# Patient Record
Sex: Male | Born: 1950
Health system: Southern US, Community
[De-identification: ages and names within clinical notes are randomized; demographics above are authoritative.]

## PROBLEM LIST (undated history)

## (undated) DIAGNOSIS — I639 Cerebral infarction, unspecified: Secondary | ICD-10-CM

## (undated) DIAGNOSIS — I10 Essential (primary) hypertension: Secondary | ICD-10-CM

## (undated) DIAGNOSIS — R51 Headache: Secondary | ICD-10-CM

## (undated) DIAGNOSIS — E785 Hyperlipidemia, unspecified: Secondary | ICD-10-CM

## (undated) DIAGNOSIS — M1990 Unspecified osteoarthritis, unspecified site: Secondary | ICD-10-CM

## (undated) DIAGNOSIS — C801 Malignant (primary) neoplasm, unspecified: Secondary | ICD-10-CM

## (undated) HISTORY — PX: APPENDECTOMY: SHX54

---

## 2000-05-10 ENCOUNTER — Ambulatory Visit (HOSPITAL_BASED_OUTPATIENT_CLINIC_OR_DEPARTMENT_OTHER): Admission: RE | Admit: 2000-05-10 | Discharge: 2000-05-10 | Payer: Self-pay | Admitting: Orthopedic Surgery

## 2003-04-19 ENCOUNTER — Inpatient Hospital Stay (HOSPITAL_COMMUNITY): Admission: RE | Admit: 2003-04-19 | Discharge: 2003-04-21 | Payer: Self-pay | Admitting: Surgery

## 2003-10-03 ENCOUNTER — Emergency Department (HOSPITAL_COMMUNITY): Admission: EM | Admit: 2003-10-03 | Discharge: 2003-10-03 | Payer: Self-pay | Admitting: Emergency Medicine

## 2006-04-16 HISTORY — PX: PROSTATECTOMY: SHX69

## 2011-02-28 ENCOUNTER — Encounter: Payer: Self-pay | Admitting: Emergency Medicine

## 2011-02-28 ENCOUNTER — Inpatient Hospital Stay (HOSPITAL_COMMUNITY)
Admission: EM | Admit: 2011-02-28 | Discharge: 2011-03-04 | DRG: 066 | Disposition: A | Discharge status: Home / Self Care | Payer: Self-pay | Attending: Internal Medicine | Admitting: Internal Medicine

## 2011-02-28 ENCOUNTER — Emergency Department (HOSPITAL_COMMUNITY): Payer: Self-pay

## 2011-02-28 ENCOUNTER — Other Ambulatory Visit: Payer: Self-pay

## 2011-02-28 DIAGNOSIS — E785 Hyperlipidemia, unspecified: Secondary | ICD-10-CM | POA: Diagnosis present

## 2011-02-28 DIAGNOSIS — Z23 Encounter for immunization: Secondary | ICD-10-CM

## 2011-02-28 DIAGNOSIS — I6350 Cerebral infarction due to unspecified occlusion or stenosis of unspecified cerebral artery: Principal | ICD-10-CM | POA: Diagnosis present

## 2011-02-28 DIAGNOSIS — I10 Essential (primary) hypertension: Secondary | ICD-10-CM | POA: Diagnosis present

## 2011-02-28 DIAGNOSIS — I639 Cerebral infarction, unspecified: Secondary | ICD-10-CM

## 2011-02-28 DIAGNOSIS — N400 Enlarged prostate without lower urinary tract symptoms: Secondary | ICD-10-CM | POA: Diagnosis present

## 2011-02-28 HISTORY — DX: Cerebral infarction, unspecified: I63.9

## 2011-02-28 HISTORY — DX: Headache: R51

## 2011-02-28 HISTORY — DX: Unspecified osteoarthritis, unspecified site: M19.90

## 2011-02-28 HISTORY — DX: Hyperlipidemia, unspecified: E78.5

## 2011-02-28 HISTORY — DX: Essential (primary) hypertension: I10

## 2011-02-28 HISTORY — DX: Malignant (primary) neoplasm, unspecified: C80.1

## 2011-02-28 LAB — DIFFERENTIAL
Basophils Absolute: 0 10*3/uL (ref 0.0–0.1)
Eosinophils Absolute: 0 10*3/uL (ref 0.0–0.7)
Eosinophils Relative: 0 % (ref 0–5)

## 2011-02-28 LAB — BASIC METABOLIC PANEL
Calcium: 9.9 mg/dL (ref 8.4–10.5)
Creatinine, Ser: 1.09 mg/dL (ref 0.50–1.35)
GFR calc non Af Amer: 72 mL/min — ABNORMAL LOW (ref >90)
Sodium: 135 mEq/L (ref 135–145)

## 2011-02-28 LAB — CBC
MCH: 32.2 pg (ref 26.0–34.0)
MCHC: 35.8 g/dL (ref 30.0–36.0)
MCV: 89.8 fL (ref 78.0–100.0)
Platelets: 276 10*3/uL (ref 150–400)
RDW: 13.3 % (ref 11.5–15.5)

## 2011-02-28 LAB — CARDIAC PANEL(CRET KIN+CKTOT+MB+TROPI)
CK, MB: 3.3 ng/mL (ref 0.3–4.0)
Total CK: 589 U/L — ABNORMAL HIGH (ref 7–232)

## 2011-02-28 MED ORDER — ONDANSETRON HCL 4 MG/2ML IJ SOLN
4.0000 mg | Freq: Once | INTRAMUSCULAR | Status: AC
  Administered 2011-02-28: 4 mg via INTRAVENOUS
  Filled 2011-02-28 (×2): qty 2

## 2011-02-28 MED ORDER — SODIUM CHLORIDE 0.9 % IV SOLN
INTRAVENOUS | Status: DC
  Administered 2011-02-28: 20:00:00 via INTRAVENOUS

## 2011-02-28 MED ORDER — ASPIRIN 81 MG PO CHEW
324.0000 mg | CHEWABLE_TABLET | Freq: Once | ORAL | Status: AC
  Administered 2011-02-28: 324 mg via ORAL
  Filled 2011-02-28: qty 4

## 2011-02-28 NOTE — ED Provider Notes (Signed)
History     CSN: [id number] Arrival date & time: 02/28/2011  4:13 PM   First MD Initiated Contact with Patient 02/28/11 1819      Chief Complaint  Patient presents with  . Dizziness  . Nausea    (Consider location/radiation/quality/duration/timing/severity/associated sxs/prior treatment) The history is provided by the patient.   patient states he woke up with dizziness about 3 the morning. He states he the room was spinning around. He felt nauseated. He feels weak and drained. No chest pain. No cough. he states that he has a frontal headache, which is unusual for him. No localizing numbness or weakness. He has not had symptoms at this before. He states he went to an urgent care told to come in here. He states he was walking and was falling to the side.  Past Medical History  Diagnosis Date  . Hypertension   . Hyperlipemia     Past Surgical History  Procedure Date  . Appendectomy   . Prostatectomy 2008    Family History  Problem Relation Age of Onset  . Hypertension Sister   . Hypertension Brother     History  Substance Use Topics  . Smoking status: Never Smoker   . Smokeless tobacco: Not on file  . Alcohol Use: No      Review of Systems  Constitutional: Positive for fatigue. Negative for activity change and appetite change.  HENT: Negative for neck stiffness.   Eyes: Negative for pain.  Respiratory: Negative for chest tightness and shortness of breath.   Cardiovascular: Negative for chest pain and leg swelling.  Gastrointestinal: Negative for nausea, vomiting, abdominal pain and diarrhea.  Genitourinary: Negative for flank pain.  Musculoskeletal: Negative for back pain.  Skin: Negative for rash.  Neurological: Positive for dizziness and headaches. Negative for weakness and numbness.  Psychiatric/Behavioral: Negative for behavioral problems.    Allergies  Review of patient's allergies indicates no known allergies.  Home Medications   Current Outpatient  Rx  Name Route Sig Dispense Refill  . PRAVASTATIN SODIUM 40 MG PO TABS Oral Take 40 mg by mouth daily.      Marland Kitchen SOLIFENACIN SUCCINATE 10 MG PO TABS Oral Take 10 mg by mouth daily.       BP 145/89  Pulse 89  Temp(Src) 98.6 F (37 C) (Oral)  Resp 22  Ht 5\' 9"  (1.753 m)  Wt 164 lb (74.39 kg)  BMI 24.22 kg/m2  SpO2 100%  Physical Exam  Nursing note and vitals reviewed. Constitutional: He is oriented to person, place, and time. He appears well-developed and well-nourished.  HENT:  Head: Normocephalic and atraumatic.  Left Ear: External ear normal.       Right TM obscured by cerumen  Eyes: EOM are normal. Pupils are equal, round, and reactive to light.  Neck: Normal range of motion. Neck supple.  Cardiovascular: Normal rate, regular rhythm and normal heart sounds.   No murmur heard. Pulmonary/Chest: Effort normal and breath sounds normal.  Abdominal: Soft. Bowel sounds are normal. He exhibits no distension and no mass. There is no tenderness. There is no rebound and no guarding.  Musculoskeletal: Normal range of motion. He exhibits no edema.  Neurological: He is alert and oriented to person, place, and time. No cranial nerve deficit.       No nystagmus. Extraocular movements intact. Negative Romberg able to ambulate forward and backwards without difficulty.  Skin: Skin is warm and dry.  Psychiatric: He has a normal mood and affect.  ED Course  Procedures (including critical care time)  Labs Reviewed  BASIC METABOLIC PANEL - Abnormal; Notable for the following:    GFR calc non Af Amer 72 (*)    GFR calc Af Amer 83 (*)    All other components within normal limits  CARDIAC PANEL(CRET KIN+CKTOT+MB+TROPI) - Abnormal; Notable for the following:    Total CK 589 (*)    All other components within normal limits  CBC  DIFFERENTIAL   Ct Head Wo Contrast  02/28/2011  *RADIOLOGY REPORT*  Clinical Data: Headache.  Vertigo.  CT HEAD WITHOUT CONTRAST  Technique:  Contiguous axial images  were obtained from the base of the skull through the vertex without contrast.  Comparison: None  Findings: The cerebral hemispheres are normal without evidence of atrophy, old or acute infarction, mass lesion, hemorrhage, hydrocephalus or extra-axial collection. With any cerebellar hemisphere, there is a small (sub-centimeter low density.  These raise the possibility of small cerebellar infarctions, age indeterminate.  I am not absolutely certain that they do not represent cerebellar folia, but the possibility of small cerebellar age indeterminate infarctions does exist.  The calvarium is unremarkable.  Sinuses, middle ears and mastoids are clear.  IMPRESSION: Cerebral hemispheres appear normal.  Question of single sub-centimeter low densities in each cerebellar hemisphere.  These could represent small cerebellar infarctions, age indeterminate.  I am not certain that they represent true pathology however.  Original Report Authenticated By: Thomasenia Sales, M.D.   Mr Brain Wo Contrast  02/28/2011  *RADIOLOGY REPORT*  Clinical Data: 60 year old male with vertigo, possible stroke. Sudden onset of dizziness.  MRI HEAD WITHOUT CONTRAST  Technique:  Multiplanar, multiecho pulse sequences of the brain and surrounding structures were obtained according to standard protocol without intravenous contrast.  Comparison: Head CT from earlier the same day.  Findings: There is a confluent area of restricted diffusion in the inferior right cerebellar hemisphere tracking medially and superiorly.  There is mild associated T2 and FLAIR hyperintensity. No mass effect or hemorrhage.  There are superimposed chronic lacunar infarcts in the medial left cerebellar hemisphere.  Supratentorial diffusion is within normal limits. Major intracranial vascular flow voids are preserved, including the distal right vertebral artery.  No midline shift, ventriculomegaly, mass effect, evidence of mass lesion, extra-axial collection or acute  intracranial hemorrhage. Cervicomedullary junction and pituitary are within normal limits. Negative visualized cervical spine.  Wallace Cullens and white matter signal in the hemispheres, deep gray matter nuclei, and brain stem is normal for age. Visualized bone marrow signal is within normal limits.  Visualized orbit soft tissues are within normal limits.  Mild paranasal sinus mucosal thickening.  Mastoids are clear. Visualized internal auditory structures are grossly normal.  Metal susceptibility artifact related probably to dental hardware. Visualized scalp soft tissues are within normal limits.  IMPRESSION: 1.  Acute patchy right cerebellar infarct, mostly PICA territory. No mass effect or hemorrhage. 2.  Superimposed chronic left PICA cerebellar infarcts.  Original Report Authenticated By: Harley Hallmark, M.D.     1. Stroke     Date: 02/28/2011  Rate: 89  Rhythm: normal sinus rhythm  QRS Axis: normal  Intervals: normal  ST/T Wave abnormalities: nonspecific T wave changes  Conduction Disutrbances:none  Narrative Interpretation: LVH. T wave inversion laterally and inferiorly. Inferior changes are new.   Old EKG Reviewed: changes noted     MDM  Patient woke up at about 3 in the morning with the feeling of dizziness and nauseousness. He states he was nauseous but  didn't vomit. States felt a little weak. He states he got up and had some trouble ambulating. He had a mild frontal headache. No other numbness or weakness. He states that his hands and legs are working fine. He was just unsteady. His right TM was obscured. His head CT showed some possible cerebellar findings. An MRI was done and showed an acute stroke and some chronic stroke. He is not a TPA candidate due to the severity of his disease and timing. I discussed with neurology, who requested admission to [facility]. I discussed with Dr. Onalee Hua who will do orders for the transfer.       Juliet Rude. Rubin Payor, MD 02/28/11 [phone]

## 2011-02-28 NOTE — ED Notes (Signed)
Ear wax removal performed pt tolerated well. Will repeat

## 2011-02-28 NOTE — ED Notes (Signed)
Pt was transported to CT.

## 2011-02-28 NOTE — ED Notes (Signed)
Pt has had a headache with dizziness since this morning.  Pt is not c/o N/V at this time.

## 2011-02-28 NOTE — H&P (Signed)
Chief Complaint:  dizzy  HPI: 60 yo male h/o hld, bph comes in with vertigo and headache and balance disturbance that started around 3am this morning.  He first felt a mild to moderate generalized headache got up took some ibu went back to lay down.  Then later when he got up he was very dizzy and having trouble walking.  Symptoms are worse when he stands up.  He denies any vision changes/slurred speech/diff swallowing.  Denies any recent illnesss/n/v/d/fever/cp/sob/abd pain.  No n/t anywhere.  Very healthy exercises 4x/week.  Review of Systems:  O/w neg  Past Medical History: Past Medical History  Diagnosis Date  . Hypertension   . Hyperlipemia    Past Surgical History  Procedure Date  . Appendectomy   . Prostatectomy 2008    Medications: Prior to Admission medications   Medication Sig Start Date End Date Taking? Authorizing Provider  pravastatin (PRAVACHOL) 40 MG tablet Take 40 mg by mouth daily.     Yes Historical Provider, MD  solifenacin (VESICARE) 10 MG tablet Take 10 mg by mouth daily.    Yes Historical Provider, MD    Allergies:  No Known Allergies  Social History:  reports that he has never smoked. He does not have any smokeless tobacco history on file. He reports that he does not drink alcohol or use illicit drugs.  Family History: Family History  Problem Relation Age of Onset  . Hypertension Sister   . Hypertension Brother     Physical Exam: Filed Vitals:   02/28/11 1623 02/28/11 1909 02/28/11 2017  BP: 144/85 152/86 145/89  Pulse:  89 89  Temp: 98.5 F (36.9 C)  98.6 F (37 C)  TempSrc: Oral  Oral  Resp: 18 20 22   Height: 5\' 9"  (1.753 m) 5\' 9"  (1.753 m)   Weight: 74.844 kg (165 lb) 74.39 kg (164 lb)   SpO2: 100% 98% 100%   General appearance: alert, cooperative and no distress Resp: clear to auscultation bilaterally Cardio: regular rate and rhythm, S1, S2 normal, no murmur, click, rub or gallop GI: soft, non-tender; bowel sounds normal; no  masses,  no organomegaly Extremities: extremities normal, atraumatic, no cyanosis or edema Pulses: 2+ and symmetric Skin: Skin color, texture, turgor normal. No rashes or lesions Neurologic: Alert and oriented X 3, normal strength and tone. Normal symmetric reflexes. Normal coordination and gait Mental status: Alert, oriented, thought content appropriate Cranial nerves: normal Motor: grossly normal Reflexes: 2+ and symmetric   Labs on Admission:   [facility] 02/28/11 1957  NA 135  K 4.6  CL 99  CO2 25  GLUCOSE 91  BUN 12  CREATININE 1.09  CALCIUM 9.9  MG --  PHOS --    Basename 02/28/11 1957  WBC 6.9  NEUTROABS 5.1  HGB 15.8  HCT 44.1  MCV 89.8  PLT 276    Basename 02/28/11 1957  CKTOTAL 589*  CKMB 3.3  CKMBINDEX --  TROPONINI <0.30   Radiological Exams on Admission: Ct Head Wo Contrast  02/28/2011  *RADIOLOGY REPORT*  Clinical Data: Headache.  Vertigo.  CT HEAD WITHOUT CONTRAST  Technique:  Contiguous axial images were obtained from the base of the skull through the vertex without contrast.  Comparison: None  Findings: The cerebral hemispheres are normal without evidence of atrophy, old or acute infarction, mass lesion, hemorrhage, hydrocephalus or extra-axial collection. With any cerebellar hemisphere, there is a small (sub-centimeter low density.  These raise the possibility of small cerebellar infarctions, age indeterminate.  I am  not absolutely certain that they do not represent cerebellar folia, but the possibility of small cerebellar age indeterminate infarctions does exist.  The calvarium is unremarkable.  Sinuses, middle ears and mastoids are clear.  IMPRESSION: Cerebral hemispheres appear normal.  Question of single sub-centimeter low densities in each cerebellar hemisphere.  These could represent small cerebellar infarctions, age indeterminate.  I am not certain that they represent true pathology however.  Original Report Authenticated By: Thomasenia Sales, M.D.    Mr Brain Wo Contrast  02/28/2011  *RADIOLOGY REPORT*  Clinical Data: 60 year old male with vertigo, possible stroke. Sudden onset of dizziness.  MRI HEAD WITHOUT CONTRAST  Technique:  Multiplanar, multiecho pulse sequences of the brain and surrounding structures were obtained according to standard protocol without intravenous contrast.  Comparison: Head CT from earlier the same day.  Findings: There is a confluent area of restricted diffusion in the inferior right cerebellar hemisphere tracking medially and superiorly.  There is mild associated T2 and FLAIR hyperintensity. No mass effect or hemorrhage.  There are superimposed chronic lacunar infarcts in the medial left cerebellar hemisphere.  Supratentorial diffusion is within normal limits. Major intracranial vascular flow voids are preserved, including the distal right vertebral artery.  No midline shift, ventriculomegaly, mass effect, evidence of mass lesion, extra-axial collection or acute intracranial hemorrhage. Cervicomedullary junction and pituitary are within normal limits. Negative visualized cervical spine.  Wallace Cullens and white matter signal in the hemispheres, deep gray matter nuclei, and brain stem is normal for age. Visualized bone marrow signal is within normal limits.  Visualized orbit soft tissues are within normal limits.  Mild paranasal sinus mucosal thickening.  Mastoids are clear. Visualized internal auditory structures are grossly normal.  Metal susceptibility artifact related probably to dental hardware. Visualized scalp soft tissues are within normal limits.  IMPRESSION: 1.  Acute patchy right cerebellar infarct, mostly PICA territory. No mass effect or hemorrhage. 2.  Superimposed chronic left PICA cerebellar infarcts.  Original Report Authenticated By: Harley Hallmark, M.D.    Assessment/Plan Present on Admission:  60 yo male acute cva 1.  Acute cva transfer to [facility], cva pathway and full neuro w/u.  Asa 325mg  daily.  Tele.  Neuro  aware and will see patient in consultation. 2.  hld  Reck in am Full code  Mc team 5  Denyla Cortese A 02/28/2011, 11:10 PM

## 2011-02-28 NOTE — ED Notes (Signed)
Pt reports headache at 3am, took Ibuprofen, woke up feeling dizzy and nauseated, ate breakfast. No vomiting. Currently "stomach feels a little weak, and I feel dizzy and drained."

## 2011-03-01 ENCOUNTER — Inpatient Hospital Stay (HOSPITAL_COMMUNITY): Payer: Self-pay

## 2011-03-01 ENCOUNTER — Encounter (HOSPITAL_COMMUNITY): Payer: Self-pay | Admitting: *Deleted

## 2011-03-01 DIAGNOSIS — I6789 Other cerebrovascular disease: Secondary | ICD-10-CM

## 2011-03-01 DIAGNOSIS — R42 Dizziness and giddiness: Secondary | ICD-10-CM

## 2011-03-01 LAB — HEMOGLOBIN A1C: Hgb A1c MFr Bld: 6.1 % — ABNORMAL HIGH (ref <5.7)

## 2011-03-01 LAB — LIPID PANEL
LDL Cholesterol: 146 mg/dL — ABNORMAL HIGH (ref 0–99)
Triglycerides: 147 mg/dL (ref <150)
VLDL: 29 mg/dL (ref 0–40)

## 2011-03-01 MED ORDER — SENNOSIDES-DOCUSATE SODIUM 8.6-50 MG PO TABS
1.0000 | ORAL_TABLET | Freq: Every evening | ORAL | Status: DC | PRN
  Filled 2011-03-01: qty 1

## 2011-03-01 MED ORDER — SIMVASTATIN 20 MG PO TABS
20.0000 mg | ORAL_TABLET | Freq: Every day | ORAL | Status: DC
  Administered 2011-03-01 – 2011-03-03 (×3): 20 mg via ORAL
  Filled 2011-03-01 (×4): qty 1

## 2011-03-01 MED ORDER — SOLIFENACIN SUCCINATE 5 MG PO TABS
10.0000 mg | ORAL_TABLET | Freq: Every day | ORAL | Status: DC
  Administered 2011-03-02: 10 mg via ORAL
  Filled 2011-03-01 (×2): qty 2

## 2011-03-01 MED ORDER — ASPIRIN 325 MG PO TABS
325.0000 mg | ORAL_TABLET | Freq: Every day | ORAL | Status: DC
  Administered 2011-03-01 – 2011-03-04 (×4): 325 mg via ORAL
  Filled 2011-03-01 (×5): qty 1

## 2011-03-01 MED ORDER — LISINOPRIL 10 MG PO TABS
10.0000 mg | ORAL_TABLET | Freq: Every day | ORAL | Status: DC
  Administered 2011-03-01 – 2011-03-04 (×4): 10 mg via ORAL
  Filled 2011-03-01 (×4): qty 1

## 2011-03-01 MED ORDER — ASPIRIN 300 MG RE SUPP
300.0000 mg | Freq: Every day | RECTAL | Status: DC
  Filled 2011-03-01 (×4): qty 1

## 2011-03-01 MED ORDER — SODIUM CHLORIDE 0.9 % IJ SOLN
3.0000 mL | Freq: Two times a day (BID) | INTRAMUSCULAR | Status: DC
  Administered 2011-03-01 – 2011-03-04 (×7): 3 mL via INTRAVENOUS

## 2011-03-01 MED ORDER — INFLUENZA VIRUS VACC SPLIT PF IM SUSP
0.5000 mL | INTRAMUSCULAR | Status: DC
  Administered 2011-03-02: 0.5 mL via INTRAMUSCULAR
  Filled 2011-03-01: qty 0.5

## 2011-03-01 MED ORDER — IOHEXOL 350 MG/ML SOLN
50.0000 mL | Freq: Once | INTRAVENOUS | Status: AC | PRN
  Administered 2011-03-01: 50 mL via INTRAVENOUS

## 2011-03-01 MED ORDER — SODIUM CHLORIDE 0.9 % IJ SOLN
3.0000 mL | INTRAMUSCULAR | Status: DC | PRN
  Administered 2011-03-03: 3 mL via INTRAVENOUS

## 2011-03-01 NOTE — Progress Notes (Signed)
OT Cancellation Note  Treatment cancelled today due to:  Pt on bedrest. Will continue to follow.  03/01/2011 Martie Round, OTR/L Pager: [phone]

## 2011-03-01 NOTE — Progress Notes (Signed)
*  PRELIMINARY RESULTS*  Carotid Dopplers has been performed.  Preliminary - No significant ICA stenosis bilaterally. Vertebral arteries are patent with antegrade flow.  Mila Homer 03/01/2011, 9:41 AM

## 2011-03-01 NOTE — Progress Notes (Signed)
Subjective: Patient seen.still complain about unsteady gait.Denies any blurry vision.No chest pain or sob  Objective: Weight change:   Intake/Output Summary (Last 24 hours) at 03/01/11 1139 Last data filed at 03/01/11 0900  Gross per 24 hour  Intake    363 ml  Output      0 ml  Net    363 ml   BP 120/70  Pulse 79  Temp(Src) 98.2 F (36.8 C) (Oral)  Resp 16  Ht 5\' 9"  (1.753 m)  Wt 74.9 kg (165 lb 2 oz)  BMI 24.38 kg/m2  SpO2 97% Physical Exam: General appearance: alert, cooperative and no distress Head: Normocephalic, without obvious abnormality, atraumatic Neck: no adenopathy, no carotid bruit, no JVD, supple, symmetrical, trachea midline and thyroid not enlarged, symmetric, no tenderness/mass/nodules Lungs: clear to auscultation bilaterally Heart: regular rate and rhythm, S1, S2 normal, no murmur, click, rub or gallop Abdomen: soft, non-tender; bowel sounds normal; no masses,  no organomegaly Extremities: extremities normal, atraumatic, no cyanosis or edema Skin: Skin color, texture, turgor normal. No rashes or lesions Neuro-muscle power 5/5 upper and lower extremity bilaterally.Gait not tested  Lab Results: @labtest @  Micro Results: No results found for this or any previous visit (from the past 240 hour(s)).  Studies/Results: Ct Head Wo Contrast  02/28/2011  *RADIOLOGY REPORT*  Clinical Data: Headache.  Vertigo.  CT HEAD WITHOUT CONTRAST  Technique:  Contiguous axial images were obtained from the base of the skull through the vertex without contrast.  Comparison: None  Findings: The cerebral hemispheres are normal without evidence of atrophy, old or acute infarction, mass lesion, hemorrhage, hydrocephalus or extra-axial collection. With any cerebellar hemisphere, there is a small (sub-centimeter low density.  These raise the possibility of small cerebellar infarctions, age indeterminate.  I am not absolutely certain that they do not represent cerebellar folia, but the  possibility of small cerebellar age indeterminate infarctions does exist.  The calvarium is unremarkable.  Sinuses, middle ears and mastoids are clear.  IMPRESSION: Cerebral hemispheres appear normal.  Question of single sub-centimeter low densities in each cerebellar hemisphere.  These could represent small cerebellar infarctions, age indeterminate.  I am not certain that they represent true pathology however.  Original Report Authenticated By: Thomasenia Sales, M.D.   Mr Brain Wo Contrast  02/28/2011  *RADIOLOGY REPORT*  Clinical Data: 60 year old male with vertigo, possible stroke. Sudden onset of dizziness.  MRI HEAD WITHOUT CONTRAST  Technique:  Multiplanar, multiecho pulse sequences of the brain and surrounding structures were obtained according to standard protocol without intravenous contrast.  Comparison: Head CT from earlier the same day.  Findings: There is a confluent area of restricted diffusion in the inferior right cerebellar hemisphere tracking medially and superiorly.  There is mild associated T2 and FLAIR hyperintensity. No mass effect or hemorrhage.  There are superimposed chronic lacunar infarcts in the medial left cerebellar hemisphere.  Supratentorial diffusion is within normal limits. Major intracranial vascular flow voids are preserved, including the distal right vertebral artery.  No midline shift, ventriculomegaly, mass effect, evidence of mass lesion, extra-axial collection or acute intracranial hemorrhage. Cervicomedullary junction and pituitary are within normal limits. Negative visualized cervical spine.  Wallace Cullens and white matter signal in the hemispheres, deep gray matter nuclei, and brain stem is normal for age. Visualized bone marrow signal is within normal limits.  Visualized orbit soft tissues are within normal limits.  Mild paranasal sinus mucosal thickening.  Mastoids are clear. Visualized internal auditory structures are grossly normal.  Metal susceptibility artifact related  probably to dental hardware. Visualized scalp soft tissues are within normal limits.  IMPRESSION: 1.  Acute patchy right cerebellar infarct, mostly PICA territory. No mass effect or hemorrhage. 2.  Superimposed chronic left PICA cerebellar infarcts.  Original Report Authenticated By: Harley Hallmark, M.D.   Medications: Scheduled Meds:   . aspirin  324 mg Oral Once  . aspirin  300 mg Rectal Daily   Or  . aspirin  325 mg Oral Daily  . influenza  inactive virus vaccine  0.5 mL Intramuscular Tomorrow-1000  . ondansetron  4 mg Intravenous Once  . simvastatin  20 mg Oral q1800  . sodium chloride  3 mL Intravenous Q12H  . solifenacin  10 mg Oral Daily   Continuous Infusions:   . DISCONTD: sodium chloride 125 mL/hr at 02/28/11 1946   PRN Meds:.senna-docusate, sodium chloride  ASSESSMENT/PLAN 1-Acute cerebellar stroke 2.-Hypertension 3-Hyperlipidemia Plan-Will continue asa and zocor.Will add lisinopril to regimen          Will order ct angiogram of the head and posterior circulation and 2d -echo with bubble          Discuss case with the neurology PA    LOS: 1 day   Montzerrat Brunell 03/01/2011, 11:39 AM

## 2011-03-01 NOTE — Progress Notes (Signed)
Utilization Review Completed.Avynn Klassen T11/15/2012   

## 2011-03-01 NOTE — Progress Notes (Signed)
Occupational Therapy Evaluation Patient Details Name: KASPIAN MUCCIO MRN: [id number] DOB: 04-Oct-1950 Today's Date: 03/01/2011  Problem List: There is no problem list on file for this patient.   Past Medical History:  Past Medical History  Diagnosis Date  . Hypertension   . Hyperlipemia   . Cancer prostate  . Stroke   . Headache rarely  . 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 Appendectomy   . Prostatectomy 2008    OT Assessment/Plan/Recommendation OT Assessment Clinical Impression Statement: Pt is performing at an independent level in mobility and ADL.  Educated in signs and symptoms of CVA and risk factors.  No further OT needs. OT Recommendation/Assessment: Patient does not need any further OT services  OT Evaluation Prior Functioning Home Living Lives With: Son (travels a lot) Type of Home: House Home Layout:  (split level ) Alternate Level Stairs-Number of Steps:  (split level home with rails 5 steps to each level) Home Access: Stairs to enter Entrance Stairs-Rails: Right Entrance Stairs-Number of Steps:  (5) Bathroom Shower/Tub: Engineer, manufacturing systems: Standard Prior Function Level of Independence: Independent with basic ADLs;Independent with homemaking with ambulation;Independent with gait;Independent with transfers Able to Take Stairs?: Yes Driving: Yes Vocation: Unemployed ADL ADL Eating/Feeding: Simulated;Independent Grooming: Simulated;Independent Upper Body Bathing: Simulated;Independent Lower Body Bathing: Simulated;Independent Upper Body Dressing: Simulated;Independent Lower Body Dressing: Simulated;Independent Toilet Transfer: Simulated;Independent Vision/Perception  Vision - History Baseline Vision: Wears glasses only for reading Patient Visual Report: No change from baseline Vision - Assessment Eye Alignment: Within Functional Limits Vision Assessment: Vision not  tested Cognition Cognition Arousal/Alertness: Awake/alert Overall Cognitive Status: Appears within functional limits for tasks assessed Orientation Level: Oriented X4 Sensation/Coordination Sensation Light Touch: Appears Intact Proprioception: Appears Intact Coordination Gross Motor Movements are Fluid and Coordinated: Yes Fine Motor Movements are Fluid and Coordinated: Yes Finger Nose Finger Test: [facility] bilaterally Extremity Assessment RUE Assessment RUE Assessment: Within Functional Limits LUE Assessment LUE Assessment: Within Functional Limits Mobility  Bed Mobility Bed Mobility: Yes Supine to Sit: 7: Independent Transfers Transfers: Yes (Independent to w/c to go to test.) Sit to Stand: 7: Independent Stand to Sit: 7: Independent End of Session OT - End of Session Equipment Utilized During Treatment: Gait belt Activity Tolerance: Patient tolerated treatment well Patient left:  (w/c to go to test) General Behavior During Session: [facility] for tasks performed Cognition: [facility] for tasks performed   Evern Bio 03/01/2011, 2:29 PM  [phone]

## 2011-03-01 NOTE — Consult Note (Signed)
Reason for Consult:Vertigo   CC: Vertigo  HPI: 60 yo male h/o hld, bph presented to [facility] hospital with main complaint of  Vertigo/headache and balance disturbance that started around 3am morning 02/28/2011. He first felt a mild to moderate generalized headache got up took some ibuprofen went back to lay down. Then later when he got up he was very dizzy and having trouble walking leaning to the right.  He had no vertigo. Symptoms are worse when he stands up.  At home he was on pravachol and vesicare.   Past Medical History  Diagnosis Date  . Hypertension   . Hyperlipemia   . Cancer prostate  . Stroke   . Headache rarely  . Arthritis ? elbow    Past Surgical History  Procedure Date  . Appendectomy   . Prostatectomy 2008    Family History  Problem Relation Age of Onset  . Hypertension Sister   . Hypertension Brother     Social History:  reports that he has never smoked. He does not have any smokeless tobacco history on file. He reports that he does not drink alcohol or use illicit drugs.  No Known Allergies  Medications:  Scheduled:   . aspirin  324 mg Oral Once  . aspirin  300 mg Rectal Daily   Or  . aspirin  325 mg Oral Daily  . influenza  inactive virus vaccine  0.5 mL Intramuscular Tomorrow-1000  . ondansetron  4 mg Intravenous Once  . simvastatin  20 mg Oral q1800  . sodium chloride  3 mL Intravenous Q12H  . solifenacin  10 mg Oral Daily    Review of Systems - General ROS: negative for - chills, fatigue, fever or hot flashes Hematological and Lymphatic ROS: negative for - bruising, fatigue, jaundice or pallor Endocrine ROS: negative for - hair pattern changes, hot flashes, mood swings or skin changes Respiratory ROS: negative for - cough, hemoptysis, orthopnea or wheezing Cardiovascular ROS: negative for - dyspnea on exertion, orthopnea, palpitations or shortness of breath Gastrointestinal ROS: negative for - abdominal pain, appetite loss, blood in stools,  diarrhea or hematemesis Musculoskeletal ROS: negative for - joint pain, joint stiffness, joint swelling or muscle pain Neurological ROS: positive for - dizziness, gait disturbance and headaches Dermatological ROS: negative for dry skin, pruritus and rash   Blood pressure 120/70, pulse 79, temperature 98.2 F (36.8 C), temperature source Oral, resp. rate 16, height 5\' 9"  (1.753 m), weight 74.9 kg (165 lb 2 oz), SpO2 97.00%.  Neurologic Examination: Mental Status: Alert, oriented, thought content appropriate.  Speech fluent without evidence of aphasia. Able to follow 3 step commands without difficulty. Cranial Nerves: II-Visual fields grossly intact. III/IV/VI-Extraocular movements intact.  Pupils reactive bilaterally. V/VII-Smile symmetric VIII-grossly intact IX/X-normal gag XI-bilateral shoulder shrug XII-midline tongue extension Motor: 5/5 bilaterally with normal tone and bulk Sensory: Pinprick and light touch intact throughout, bilaterally Deep Tendon Reflexes: 2+ and symmetric throughout Plantars: Downgoing bilaterally Cerebellar: Normal finger-to-nose, normal rapid alternating movements and normal heel-to-shin test.  Slow gait but no significant swaying.  On Rhomberg he swayed slightly to the right but did not need to take a wide base gait.       Results for orders placed during the hospital encounter of 02/28/11 (from the past 48 hour(s))  CBC     Status: Normal   Collection Time   02/28/11  7:57 PM      Component Value Range Comment   WBC 6.9  4.0 - 10.5 (K/uL)  RBC 4.91  4.22 - 5.81 (MIL/uL)    Hemoglobin 15.8  13.0 - 17.0 (g/dL)    HCT 40.9  81.1 - 91.4 (%)    MCV 89.8  78.0 - 100.0 (fL)    MCH 32.2  26.0 - 34.0 (pg)    MCHC 35.8  30.0 - 36.0 (g/dL)    RDW 78.2  95.6 - 21.3 (%)    Platelets 276  150 - 400 (K/uL)   DIFFERENTIAL     Status: Normal   Collection Time   02/28/11  7:57 PM      Component Value Range Comment   Neutrophils Relative 74  43 - 77 (%)     Neutro Abs 5.1  1.7 - 7.7 (K/uL)    Lymphocytes Relative 20  12 - 46 (%)    Lymphs Abs 1.4  0.7 - 4.0 (K/uL)    Monocytes Relative 6  3 - 12 (%)    Monocytes Absolute 0.4  0.1 - 1.0 (K/uL)    Eosinophils Relative 0  0 - 5 (%)    Eosinophils Absolute 0.0  0.0 - 0.7 (K/uL)    Basophils Relative 0  0 - 1 (%)    Basophils Absolute 0.0  0.0 - 0.1 (K/uL)   BASIC METABOLIC PANEL     Status: Abnormal   Collection Time   02/28/11  7:57 PM      Component Value Range Comment   Sodium 135  135 - 145 (mEq/L)    Potassium 4.6  3.5 - 5.1 (mEq/L)    Chloride 99  96 - 112 (mEq/L)    CO2 25  19 - 32 (mEq/L)    Glucose, Bld 91  70 - 99 (mg/dL)    BUN 12  6 - 23 (mg/dL)    Creatinine, Ser 0.86  0.50 - 1.35 (mg/dL)    Calcium 9.9  8.4 - 10.5 (mg/dL)    GFR calc non Af Amer 72 (*) >90 (mL/min)    GFR calc Af Amer 83 (*) >90 (mL/min)   CARDIAC PANEL(CRET KIN+CKTOT+MB+TROPI)     Status: Abnormal   Collection Time   02/28/11  7:57 PM      Component Value Range Comment   Total CK 589 (*) 7 - 232 (U/L)    CK, MB 3.3  0.3 - 4.0 (ng/mL)    Troponin I <0.30  <0.30 (ng/mL)    Relative Index 0.6  0.0 - 2.5    LIPID PANEL     Status: Abnormal   Collection Time   03/01/11  6:10 AM      Component Value Range Comment   Cholesterol 211 (*) 0 - 200 (mg/dL)    Triglycerides 578  <150 (mg/dL)    HDL 36 (*) >46 (mg/dL)    Total CHOL/HDL Ratio 5.9      VLDL 29  0 - 40 (mg/dL)    LDL Cholesterol 962 (*) 0 - 99 (mg/dL)     Ct Head Wo Contrast  02/28/2011  *RADIOLOGY REPORT*  Clinical Data: Headache.  Vertigo.  CT HEAD WITHOUT CONTRAST  Technique:  Contiguous axial images were obtained from the base of the skull through the vertex without contrast.  Comparison: None  Findings: The cerebral hemispheres are normal without evidence of atrophy, old or acute infarction, mass lesion, hemorrhage, hydrocephalus or extra-axial collection. With any cerebellar hemisphere, there is a small (sub-centimeter low density.  These  raise the possibility of small cerebellar infarctions, age indeterminate.  I am not absolutely  certain that they do not represent cerebellar folia, but the possibility of small cerebellar age indeterminate infarctions does exist.  The calvarium is unremarkable.  Sinuses, middle ears and mastoids are clear.    IMPRESSION: Cerebral hemispheres appear normal.  Question of single sub-centimeter low densities in each cerebellar hemisphere.  These could represent small cerebellar infarctions, age indeterminate.  I am not certain that they represent true pathology however.  Original Report Authenticated By: Thomasenia Sales, M.D.   Mr Brain Wo Contrast  02/28/2011  *RADIOLOGY REPORT*  Clinical Data: 60 year old male with vertigo, possible stroke. Sudden onset of dizziness.  MRI HEAD WITHOUT CONTRAST  Technique:  Multiplanar, multiecho pulse sequences of the brain and surrounding structures were obtained according to standard protocol without intravenous contrast.  Comparison: Head CT from earlier the same day.  Findings: There is a confluent area of restricted diffusion in the inferior right cerebellar hemisphere tracking medially and superiorly.  There is mild associated T2 and FLAIR hyperintensity. No mass effect or hemorrhage.  There are superimposed chronic lacunar infarcts in the medial left cerebellar hemisphere.  Supratentorial diffusion is within normal limits. Major intracranial vascular flow voids are preserved, including the distal right vertebral artery.  No midline shift, ventriculomegaly, mass effect, evidence of mass lesion, extra-axial collection or acute intracranial hemorrhage. Cervicomedullary junction and pituitary are within normal limits. Negative visualized cervical spine.  Wallace Cullens and white matter signal in the hemispheres, deep gray matter nuclei, and brain stem is normal for age. Visualized bone marrow signal is within normal limits.  Visualized orbit soft tissues are within normal limits.  Mild  paranasal sinus mucosal thickening.  Mastoids are clear. Visualized internal auditory structures are grossly normal.  Metal susceptibility artifact related probably to dental hardware. Visualized scalp soft tissues are within normal limits.    IMPRESSION: 1.  Acute patchy right cerebellar infarct, mostly PICA territory. No mass effect or hemorrhage. 2.  Superimposed chronic left PICA cerebellar infarcts.  Original Report Authenticated By: Harley Hallmark, M.D.   Carotid Dopplers have been performed.  Preliminary - No significant ICA stenosis bilaterally. Vertebral arteries are patent with antegrade flow.  2D Echo PENDING  Assessment/Plan: 60 YO male who has suffered a Acute patchy right cerebellar infarct, mostly PICA territory .   No history of previous infarct.  LDL 146 Stroke Risk Factors - hyperlipidemia   Plan: 1. HgbA1c 2. PT consult, OT consult 4. Echocardiogram results to be followed up 5. Prophylactic therapy-Antiplatelet med: Aspirin - dose 325 mg q day 6. Risk factor modification with increase in statin medication      Felicie Morn PA-C [facility] [phone]  03/01/2011, 10:49 AM    Patient seen and examined. I agree with the above.  Thana Farr, MD [facility] [phone]  03/01/2011  6:18 PM

## 2011-03-01 NOTE — Progress Notes (Addendum)
  Echocardiogram 2D Echocardiogram has been performed.  Sandford Craze, RCS 03/01/2011, 9:47 AM

## 2011-03-01 NOTE — Progress Notes (Signed)
Physical Therapy Evaluation Patient Details Name: Christopher Sweeney MRN: [id number] DOB: [birth year] Today's Date: 03/01/2011  Problem List: There is no problem list on file for this patient.   Past Medical History:  Past Medical History  Diagnosis Date  . Hypertension   . Hyperlipemia   . Cancer prostate  . Stroke   . Headache rarely  . 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 Appendectomy   . Prostatectomy 2008    PT Assessment/Plan/Recommendation PT Assessment Clinical Impression Statement: Pt is a 60 y/o male with acute cerebellar CVA. Does exhibit some higher level balance deficits (SLS, instability with head turns) but dizziness appears to be resolved. Pt very functional with good safety awareness and great activity level (works out at his [employer]). No further acute PT at this time as pt is Independent with ambulation. Ed pt on stroke signs and symptoms, his increased risk for stroke and importance of quickly getting to hospital.  PT Recommendation/Assessment: Patent does not need any further PT services PT Goals     PT Evaluation   Prior Functioning  Home Living Lives With: Son (travels a lot) Type of Home: House Home Layout:  (split level ) Alternate Level Stairs-Number of Steps:  (split level home with rails 5 steps to each level) Home Access: Stairs to enter Entrance Stairs-Rails: Right Entrance Stairs-Number of Steps:  (5) Bathroom Shower/Tub: Engineer, manufacturing systems: Standard Prior Function Level of Independence: Independent with basic ADLs;Independent with homemaking with ambulation;Independent with gait;Independent with transfers Able to Take Stairs?: Yes Driving: Yes Vocation: Unemployed Cognition Cognition Arousal/Alertness: Awake/alert Overall Cognitive Status: Appears within functional limits for tasks assessed Orientation Level: Oriented X4 Sensation/Coordination Sensation Light Touch: Appears  Intact Proprioception: Appears Intact Coordination Gross Motor Movements are Fluid and Coordinated: Yes Fine Motor Movements are Fluid and Coordinated: Yes Finger Nose Finger Test: [facility] bilaterally Extremity Assessment RUE Assessment RUE Assessment: Within Functional Limits LUE Assessment LUE Assessment: Within Functional Limits RLE Assessment RLE Assessment: Within Functional Limits LLE Assessment LLE Assessment: Within Functional Limits Mobility (including Balance) Bed Mobility Bed Mobility: Yes Supine to Sit: 7: Independent Transfers Transfers: Yes Sit to Stand: 7: Independent Stand to Sit: 7: Independent Ambulation/Gait Ambulation/Gait: Yes Ambulation/Gait Assistance: 7: Independent Ambulation/Gait Assistance Details (indicate cue type and reason): Pt ambulated 150 ft without LOB. Good ability to change gait speed, step over obstacles, negotiate obstacles. Mininimal stagger with lateral head turns and slows down with vertical head turns but no LOB and able to self correct. No reports of dizziness at all.  Ambulation Distance (Feet): 150 Feet Assistive device: None Gait Pattern: Within Functional Limits  Posture/Postural Control Posture/Postural Control: No significant limitations Balance Balance Assessed: Yes Static Standing Balance Static Standing - Balance Support: No upper extremity supported Static Standing - Level of Assistance: 7: Independent;6: Modified independent (Device/Increase time) Single Leg Stance - Right Leg:  10-15 sec before pt had to put foot down, no major LOB, pt able to self correct Single Leg Stance - Left Leg:  Same as right Rhomberg - Eyes Opened: 60 sec Rhomberg - Eyes Closed: 60 sec  (increased sway, no LOB)   End of Session PT - End of Session Equipment Utilized During Treatment: Gait belt Activity Tolerance: Patient tolerated treatment well Patient left:  (pt with radiology transport to go down for test) Nurse Communication: Mobility  status for transfers;Mobility status for ambulation General Behavior During Session: [facility] for tasks performed Cognition: [facility] for  tasks performed  [facility] HELEN 03/01/2011, 2:36 PM

## 2011-03-02 ENCOUNTER — Encounter (HOSPITAL_COMMUNITY)
Admission: EM | Disposition: A | Discharge status: Home / Self Care | Payer: Self-pay | Source: Home / Self Care | Attending: Internal Medicine

## 2011-03-02 DIAGNOSIS — I6789 Other cerebrovascular disease: Secondary | ICD-10-CM

## 2011-03-02 HISTORY — PX: TEE WITHOUT CARDIOVERSION: SHX5443

## 2011-03-02 SURGERY — ECHOCARDIOGRAM, TRANSESOPHAGEAL
Anesthesia: Moderate Sedation

## 2011-03-02 MED ORDER — LIDOCAINE VISCOUS 2 % MT SOLN
OROMUCOSAL | Status: DC | PRN
  Administered 2011-03-02: 20 mL via OROMUCOSAL

## 2011-03-02 MED ORDER — SODIUM CHLORIDE 0.9 % IJ SOLN: 3.0000 mL | INTRAMUSCULAR | Status: DC | PRN

## 2011-03-02 MED ORDER — FENTANYL CITRATE 0.05 MG/ML IJ SOLN
INTRAMUSCULAR | Status: AC
  Filled 2011-03-02: qty 2

## 2011-03-02 MED ORDER — SOLIFENACIN SUCCINATE 5 MG PO TABS
10.0000 mg | ORAL_TABLET | Freq: Every day | ORAL | Status: DC
  Administered 2011-03-03: 10 mg via ORAL

## 2011-03-02 MED ORDER — MIDAZOLAM HCL 10 MG/2ML IJ SOLN: 10.0000 mg | Freq: Once | INTRAMUSCULAR | Status: DC

## 2011-03-02 MED ORDER — SODIUM CHLORIDE 0.9 % IJ SOLN
3.0000 mL | Freq: Two times a day (BID) | INTRAMUSCULAR | Status: DC
  Administered 2011-03-03 (×3): 3 mL via INTRAVENOUS

## 2011-03-02 MED ORDER — MIDAZOLAM HCL 10 MG/2ML IJ SOLN
INTRAMUSCULAR | Status: DC | PRN
  Administered 2011-03-02 (×2): 2 mg via INTRAVENOUS

## 2011-03-02 MED ORDER — MIDAZOLAM HCL 10 MG/2ML IJ SOLN
INTRAMUSCULAR | Status: AC
  Filled 2011-03-02: qty 2

## 2011-03-02 MED ORDER — SODIUM CHLORIDE 0.9 % IV SOLN
250.0000 mL | INTRAVENOUS | Status: DC
  Administered 2011-03-02: 250 mL via INTRAVENOUS

## 2011-03-02 MED ORDER — FENTANYL CITRATE 0.05 MG/ML IJ SOLN: 250.0000 ug | Freq: Once | INTRAMUSCULAR | Status: DC

## 2011-03-02 MED ORDER — INFLUENZA VIRUS VACC SPLIT PF IM SUSP
0.5000 mL | Freq: Once | INTRAMUSCULAR | Status: AC
  Administered 2011-03-02: 0.5 mL via INTRAMUSCULAR
  Filled 2011-03-02: qty 0.5

## 2011-03-02 MED ORDER — LIDOCAINE VISCOUS 2 % MT SOLN
OROMUCOSAL | Status: AC
  Filled 2011-03-02: qty 15

## 2011-03-02 MED ORDER — BENZOCAINE 20 % MT SOLN: 1.0000 "application " | OROMUCOSAL | Status: DC | PRN

## 2011-03-02 MED ORDER — FENTANYL CITRATE 0.05 MG/ML IJ SOLN
INTRAMUSCULAR | Status: DC | PRN
  Administered 2011-03-02 (×2): 25 ug via INTRAVENOUS

## 2011-03-02 NOTE — Progress Notes (Signed)
Speech Language/Pathology Speech Language Pathology Evaluation Patient Details Name: Christopher Sweeney MRN: [id number] DOB: 1950/09/09 Today's Date: 03/02/2011  Problem List: There is no problem list on file for this patient.   Past Medical History:  Past Medical History  Diagnosis Date  . Hypertension   . Hyperlipemia   . Cancer prostate  . Stroke   . Headache rarely  . 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 Appendectomy   . Prostatectomy 2008    SLP Assessment/Plan/Recommendation Assessment Clinical Impression Statement: Pt.'s speech intelligibility, language and cognitive status were WFL's during screen.  No STservices needed at this time. SLP Recommendation/Assessment: Patent does not need any further Speech Lanaguage Pathology Services No Skilled Speech Therapy: Patient at baseline level of functioning Recommendation Follow up Recommendations: None Individuals Consulted Consulted and Agree with Results and Recommendations: Patient  Prior Functioning  Prior Functional Status Cognitive/Linguistic Baseline: Within functional limits Type of Home: House Lives With: Son (travels a lot) Vocation: Part time employment Cognition Cognition Overall Cognitive Status: Appears within functional limits for tasks assessed Arousal/Alertness: Awake/alert Orientation Level: Oriented X4 Comprehension  Auditory Comprehension Yes/No Questions: Within Functional Limits Commands: Within Functional Limits Conversation: Simple Visual Recognition/Discrimination Discrimination: Not tested Reading Comprehension Reading Status: Not tested (no pt complaints with reading newspaper) Expression  Expression Primary Mode of Expression: Verbal Verbal Expression Initiation: No impairment Level of Generative/Spontaneous Verbalization: Conversation Repetition: No impairment Naming: No impairment Pragmatics: No impairment Written  Expression Written Expression: Not tested Oral/Motor  Oral Motor/Sensory Function Labial ROM: Within Functional Limits Labial Symmetry: Within Functional Limits Labial Strength: Within Functional Limits Lingual ROM: Within Functional Limits Lingual Symmetry: Within Functional Limits Lingual Strength: Within Functional Limits Facial ROM: Within Functional Limits Facial Symmetry: Within Functional Limits Facial Strength: Within Functional Limits Mandible: Within Functional Limits Motor Speech Intelligibility: Intelligible  Darrow Bussing.Ed [employer] [phone] 03/02/2011

## 2011-03-02 NOTE — Progress Notes (Signed)
Stroke Team Progress Note  SUBJECTIVE 60 yo male h/o hyperlipidimia, bph presented to [facility] hospital with main complaint of Vertigo/headache and balance disturbance that started around 3am morning 02/28/2011. He first felt a mild to moderate generalized headache got up took some ibuprofen went back to lay down. Then later when he got up he was very dizzy and having trouble walking leaning to the right. He had no vertigo. Symptoms are worse when he stands up. He has today he improved with his dizziness and balance. He has no complaints  OBJECTIVE Most recent Vital Signs: Temp: 98 F (36.7 C) (11/16 0800) Temp src: Oral (11/16 0800) BP: 116/72 mmHg (11/16 0800) Pulse Rate: 76  (11/16 0800) Respiratory Rate: 18 O2 Saturdation: 96%  CBG (last 3)  No results found for this basename: GLUCAP:3 in the last 72 hours  Intake/Output from previous day: 11/15 0701 - 11/16 0700 In: 603 [P.O.:600; I.V.:3] Out: -   IV Fluid Intake:   none Diet:  Cardiac thin liquids  Activity:  Up with assistance  DVT Prophylaxis:  none  Studies: CBC    Component Value Date/Time   WBC 6.9 02/28/2011 1957   RBC 4.91 02/28/2011 1957   HGB 15.8 02/28/2011 1957   HCT 44.1 02/28/2011 1957   PLT 276 02/28/2011 1957   MCV 89.8 02/28/2011 1957   MCH 32.2 02/28/2011 1957   MCHC 35.8 02/28/2011 1957   RDW 13.3 02/28/2011 1957   LYMPHSABS 1.4 02/28/2011 1957   MONOABS 0.4 02/28/2011 1957   EOSABS 0.0 02/28/2011 1957   BASOSABS 0.0 02/28/2011 1957   CMP    Component Value Date/Time   NA 135 02/28/2011 1957   K 4.6 02/28/2011 1957   CL 99 02/28/2011 1957   CO2 25 02/28/2011 1957   GLUCOSE 91 02/28/2011 1957   BUN 12 02/28/2011 1957   CREATININE 1.09 02/28/2011 1957   CALCIUM 9.9 02/28/2011 1957   GFRNONAA 72* 02/28/2011 1957   GFRAA 83* 02/28/2011 1957   COAGS No results found for this basename: INR, PROTIME   Lipid Panel    Component Value Date/Time   CHOL 211* 03/01/2011 0610   TRIG 147  03/01/2011 0610   HDL 36* 03/01/2011 0610   CHOLHDL 5.9 03/01/2011 0610   VLDL 29 03/01/2011 0610   LDLCALC 146* 03/01/2011 0610   HgbA1C  Lab Results  Component Value Date   HGBA1C 6.1* 03/01/2011   Urine Drug Screen  No results found for this basename: labopia, cocainscrnur, labbenz, amphetmu, thcu, labbarb    Alcohol Level No results found for this basename: eth     CT angio of the brain:  1.  Minimal distal right internal carotid artery calcification without focal stenosis. 2.  Normal variant posterior circulation with predominately fetal type posterior cerebral arteries bilaterally. 3.  Bilateral inferior cerebellar infarcts, more remote on the left.    CT angio of the neck:  1.  Minimal atherosclerotic calcification along the posterior margin of the right internal carotid artery without significant stenosis. 2.  No other focal vascular disease in the neck. 3.  Mild sinus disease. Marland Kitchen   MRI of the brain: 1.  Acute patchy right cerebellar infarct, mostly PICA territory. No mass effect or hemorrhage. 2.  Superimposed chronic left PICA cerebellar infarcts.  2D Echocardiogram: EF 55-60% with no source of embolus.  EKG: normal sinus rhythm.   Physical Exam:   Awake alert oriented to time place and person. Speech and language appear normal. No dysarthria. Normal eye  movements without nystagmus and visual fields appear reveals  full to confrontational testing. Facessymmetric without weakness. Tongue is midline. Motor system exam reveals no upper extremity drift. No focal weakness. Finger to nose and knee and knee to heel coordination are adequate. There is no truncal ataxia. His gait was not tested.  Cardiac exam no murmur a gallop. Lungs clear to auscultation.  ASSESSMENT [name] is a 60 y.o. male with a acute patchy right cerebellar infarct, mostly PICA territory. No history of previous infarct BUT mri shows silent left cerebellar tiny infarct Etiology unclear. ? Embolic.  On Aspirin 325mg  daily prior to admission but not taking it regularly  Hyperlipidemia, on pravachol prior to admission, not consistent with taking daily per pt.  Hospital day # 2  TREATMENT/PLAN TEE, will try to arrange for today.for an outpatient  3 week prolonged telemetry for paroxysmal AFIB. Continue aspirin for now. Check 2-D echo. Consider participation in IRIS or ACCELERATE stroke prevention trials. Follow up with me as an outpatient in 2 months.  Joaquin Music, ANP-BC, GNP-BC [facility] Pager: [phone] 03/02/2011 9:00 AM  Dr. Delia Heady, Stroke Center Medical Director, has personally reviewed chart, pertinent data, examined the patient and developed the plan of care.

## 2011-03-02 NOTE — H&P (View-Only) (Signed)
Subjective: Patient seen.Feels better.No dizziness or vertiginous symptoms  Objective: Weight change: -0.443 kg (-15.6 oz)  Intake/Output Summary (Last 24 hours) at 03/02/11 1025 Last data filed at 03/01/11 2227  Gross per 24 hour  Intake    243 ml  Output      0 ml  Net    243 ml   BP 116/72  Pulse 76  Temp(Src) 98 F (36.7 C) (Oral)  Resp 18  Ht 5' 9" (1.753 m)  Wt 74.4 kg (164 lb 0.4 oz)  BMI 24.22 kg/m2  SpO2 96% Physical Exam: General appearance: alert, cooperative and no distress Head: Normocephalic, without obvious abnormality, atraumatic Neck: no adenopathy, no carotid bruit, no JVD, supple, symmetrical, trachea midline and thyroid not enlarged, symmetric, no tenderness/mass/nodules Lungs: clear to auscultation bilaterally Heart: regular rate and rhythm, S1, S2 normal, no murmur, click, rub or gallop Abdomen: soft, non-tender; bowel sounds normal; no masses,  no organomegaly Extremities: extremities normal, atraumatic, no cyanosis or edema Skin: Skin color, texture, turgor normal. No rashes or lesions Neuro-non focal  Lab Results: @labtest@  Micro Results: No results found for this or any previous visit (from the past 240 hour(s)).  Studies/Results: Ct Angio Head W/cm &/or Wo Cm  03/01/2011  *RADIOLOGY REPORT*  Clinical Data:  Cerebellar infarct.  CT ANGIOGRAPHY HEAD AND NECK  Technique:  Multidetector CT imaging of the head and neck was performed using the standard protocol during bolus administration of intravenous contrast.  Multiplanar CT image reconstructions including MIPs were obtained to evaluate the vascular anatomy. Carotid stenosis measurements (when applicable) are obtained utilizing NASCET criteria, using the distal internal carotid diameter as the denominator.  Contrast: 50mL OMNIPAQUE IOHEXOL 350 MG/ML IV SOLN  Comparison:  MRI brain 02/28/2011.  CTA NECK  Findings:  There is a common origin of the left common carotid artery and the innominate  artery.  No significant proximal stenosis is evident.  Both vertebral arteries originate from the subclavian arteries.  The right vertebral artery is slightly dominant to the left.  There are no focal stenoses within the neck.  The right common carotid artery is within normal limits.  There is a focal calcified plaque along the posterior margin of the proximal right internal carotid artery without significant stenosis.  The right internal carotid artery is otherwise within normal limits.  The left common carotid artery is normal.  The bifurcation is unremarkable.  The left internal carotid artery is normal.  The soft tissues of the neck are unremarkable.  The thyroid is within normal limits.  Scattered small lymph nodes are present bilaterally.  No pathologically enlarged nodes are evident.  Mild mucosal thickening is noted in the maxillary sinuses and ethmoid air cells bilaterally. Flow  Mild dependent atelectasis is present in the visualized lungs. Mild degenerative changes are noted in the lower cervical spine without evidence for focal stenosis.   Review of the MIP images confirms the above findings.  IMPRESSION:  1.  Minimal atherosclerotic calcification along the posterior margin of the right internal carotid artery without significant stenosis. 2.  No other focal vascular disease in the neck. 3.  Mild sinus disease. .  CTA HEAD  Findings:  Bilateral cerebellar infarcts are again seen, more remote on the left.  No other acute ischemic regions are evident.  The distal right internal carotid artery demonstrates minimal calcification along the supraclinoid segment.  There is no significant stenosis.  Minimal calcifications are present within the cavernous left internal carotid artery without significant stenosis.    The A1 and M1 segments are normal.  The MCA bifurcation is within normal limits bilaterally. ACA and MCA branch vessels are unremarkable.  The right vertebral artery is the dominant vessel.  The PICA  origins are visualized and normal bilaterally.  The basilar artery is small.  Both posterior cerebral arteries originate from the posterior communicating arteries.  There is a small P1 segment on the right.  PCA branch vessels are normal bilaterally.   Review of the MIP images confirms the above findings.  IMPRESSION:  1.  Minimal distal right internal carotid artery calcification without focal stenosis. 2.  Normal variant posterior circulation with predominately fetal type posterior cerebral arteries bilaterally. 3.  Bilateral inferior cerebellar infarcts, more remote on the left.  Original Report Authenticated By: CHRISTOPHER W. MATTERN, M.D.   Ct Head Wo Contrast  02/28/2011  *RADIOLOGY REPORT*  Clinical Data: Headache.  Vertigo.  CT HEAD WITHOUT CONTRAST  Technique:  Contiguous axial images were obtained from the base of the skull through the vertex without contrast.  Comparison: None  Findings: The cerebral hemispheres are normal without evidence of atrophy, old or acute infarction, mass lesion, hemorrhage, hydrocephalus or extra-axial collection. With any cerebellar hemisphere, there is a small (sub-centimeter low density.  These raise the possibility of small cerebellar infarctions, age indeterminate.  I am not absolutely certain that they do not represent cerebellar folia, but the possibility of small cerebellar age indeterminate infarctions does exist.  The calvarium is unremarkable.  Sinuses, middle ears and mastoids are clear.  IMPRESSION: Cerebral hemispheres appear normal.  Question of single sub-centimeter low densities in each cerebellar hemisphere.  These could represent small cerebellar infarctions, age indeterminate.  I am not certain that they represent true pathology however.  Original Report Authenticated By: MARK E. SHOGRY, M.D.   Ct Angio Neck W/cm &/or Wo/cm  03/01/2011  *RADIOLOGY REPORT*  Clinical Data:  Cerebellar infarct.  CT ANGIOGRAPHY HEAD AND NECK  Technique:  Multidetector CT  imaging of the head and neck was performed using the standard protocol during bolus administration of intravenous contrast.  Multiplanar CT image reconstructions including MIPs were obtained to evaluate the vascular anatomy. Carotid stenosis measurements (when applicable) are obtained utilizing NASCET criteria, using the distal internal carotid diameter as the denominator.  Contrast: 50mL OMNIPAQUE IOHEXOL 350 MG/ML IV SOLN  Comparison:  MRI brain 02/28/2011.  CTA NECK  Findings:  There is a common origin of the left common carotid artery and the innominate artery.  No significant proximal stenosis is evident.  Both vertebral arteries originate from the subclavian arteries.  The right vertebral artery is slightly dominant to the left.  There are no focal stenoses within the neck.  The right common carotid artery is within normal limits.  There is a focal calcified plaque along the posterior margin of the proximal right internal carotid artery without significant stenosis.  The right internal carotid artery is otherwise within normal limits.  The left common carotid artery is normal.  The bifurcation is unremarkable.  The left internal carotid artery is normal.  The soft tissues of the neck are unremarkable.  The thyroid is within normal limits.  Scattered small lymph nodes are present bilaterally.  No pathologically enlarged nodes are evident.  Mild mucosal thickening is noted in the maxillary sinuses and ethmoid air cells bilaterally. Flow  Mild dependent atelectasis is present in the visualized lungs. Mild degenerative changes are noted in the lower cervical spine without evidence for focal stenosis.   Review of   the MIP images confirms the above findings.  IMPRESSION:  1.  Minimal atherosclerotic calcification along the posterior margin of the right internal carotid artery without significant stenosis. 2.  No other focal vascular disease in the neck. 3.  Mild sinus disease. .  CTA HEAD  Findings:  Bilateral  cerebellar infarcts are again seen, more remote on the left.  No other acute ischemic regions are evident.  The distal right internal carotid artery demonstrates minimal calcification along the supraclinoid segment.  There is no significant stenosis.  Minimal calcifications are present within the cavernous left internal carotid artery without significant stenosis.  The A1 and M1 segments are normal.  The MCA bifurcation is within normal limits bilaterally. ACA and MCA branch vessels are unremarkable.  The right vertebral artery is the dominant vessel.  The PICA origins are visualized and normal bilaterally.  The basilar artery is small.  Both posterior cerebral arteries originate from the posterior communicating arteries.  There is a small P1 segment on the right.  PCA branch vessels are normal bilaterally.   Review of the MIP images confirms the above findings.  IMPRESSION:  1.  Minimal distal right internal carotid artery calcification without focal stenosis. 2.  Normal variant posterior circulation with predominately fetal type posterior cerebral arteries bilaterally. 3.  Bilateral inferior cerebellar infarcts, more remote on the left.  Original Report Authenticated By: CHRISTOPHER W. MATTERN, M.D.   Mr Brain Wo Contrast  02/28/2011  *RADIOLOGY REPORT*  Clinical Data: 60-year-old male with vertigo, possible stroke. Sudden onset of dizziness.  MRI HEAD WITHOUT CONTRAST  Technique:  Multiplanar, multiecho pulse sequences of the brain and surrounding structures were obtained according to standard protocol without intravenous contrast.  Comparison: Head CT from earlier the same day.  Findings: There is a confluent area of restricted diffusion in the inferior right cerebellar hemisphere tracking medially and superiorly.  There is mild associated T2 and FLAIR hyperintensity. No mass effect or hemorrhage.  There are superimposed chronic lacunar infarcts in the medial left cerebellar hemisphere.  Supratentorial  diffusion is within normal limits. Major intracranial vascular flow voids are preserved, including the distal right vertebral artery.  No midline shift, ventriculomegaly, mass effect, evidence of mass lesion, extra-axial collection or acute intracranial hemorrhage. Cervicomedullary junction and pituitary are within normal limits. Negative visualized cervical spine.  Gray and white matter signal in the hemispheres, deep gray matter nuclei, and brain stem is normal for age. Visualized bone marrow signal is within normal limits.  Visualized orbit soft tissues are within normal limits.  Mild paranasal sinus mucosal thickening.  Mastoids are clear. Visualized internal auditory structures are grossly normal.  Metal susceptibility artifact related probably to dental hardware. Visualized scalp soft tissues are within normal limits.  IMPRESSION: 1.  Acute patchy right cerebellar infarct, mostly PICA territory. No mass effect or hemorrhage. 2.  Superimposed chronic left PICA cerebellar infarcts.  Original Report Authenticated By: H.LEE HALL III, M.D.   Medications: Scheduled Meds:   . aspirin  300 mg Rectal Daily   Or  . aspirin  325 mg Oral Daily  . influenza  inactive virus vaccine  0.5 mL Intramuscular Tomorrow-1000  . lisinopril  10 mg Oral Daily  . simvastatin  20 mg Oral q1800  . sodium chloride  3 mL Intravenous Q12H  . solifenacin  10 mg Oral Daily   Continuous Infusions:  PRN Meds:.iohexol, senna-docusate, sodium chloride  Assessment/Plan: #1 Cerebellar CVA-Pt responding to treatment.Patient also tolerating physical therapy.Plan is to continue asa and   zocor. #2 Hypertension-BP STABLE #3 Hyperlipidemia-will continue zocor  LOS: 2 days   Faust Thorington 03/02/2011, 10:25 AM 

## 2011-03-02 NOTE — Progress Notes (Signed)
Subjective: Patient seen.Feels better.No dizziness or vertiginous symptoms  Objective: Weight change: -0.443 kg (-15.6 oz)  Intake/Output Summary (Last 24 hours) at 03/02/11 1025 Last data filed at 03/01/11 2227  Gross per 24 hour  Intake    243 ml  Output      0 ml  Net    243 ml   BP 116/72  Pulse 76  Temp(Src) 98 F (36.7 C) (Oral)  Resp 18  Ht 5\' 9"  (1.753 m)  Wt 74.4 kg (164 lb 0.4 oz)  BMI 24.22 kg/m2  SpO2 96% Physical Exam: General appearance: alert, cooperative and no distress Head: Normocephalic, without obvious abnormality, atraumatic Neck: no adenopathy, no carotid bruit, no JVD, supple, symmetrical, trachea midline and thyroid not enlarged, symmetric, no tenderness/mass/nodules Lungs: clear to auscultation bilaterally Heart: regular rate and rhythm, S1, S2 normal, no murmur, click, rub or gallop Abdomen: soft, non-tender; bowel sounds normal; no masses,  no organomegaly Extremities: extremities normal, atraumatic, no cyanosis or edema Skin: Skin color, texture, turgor normal. No rashes or lesions Neuro-non focal  Lab Results: @labtest @  Micro Results: No results found for this or any previous visit (from the past 240 hour(s)).  Studies/Results: Ct Angio Head W/cm &/or Wo Cm  03/01/2011  *RADIOLOGY REPORT*  Clinical Data:  Cerebellar infarct.  CT ANGIOGRAPHY HEAD AND NECK  Technique:  Multidetector CT imaging of the head and neck was performed using the standard protocol during bolus administration of intravenous contrast.  Multiplanar CT image reconstructions including MIPs were obtained to evaluate the vascular anatomy. Carotid stenosis measurements (when applicable) are obtained utilizing NASCET criteria, using the distal internal carotid diameter as the denominator.  Contrast: 50mL OMNIPAQUE IOHEXOL 350 MG/ML IV SOLN  Comparison:  MRI brain 02/28/2011.  CTA NECK  Findings:  There is a common origin of the left common carotid artery and the innominate  artery.  No significant proximal stenosis is evident.  Both vertebral arteries originate from the subclavian arteries.  The right vertebral artery is slightly dominant to the left.  There are no focal stenoses within the neck.  The right common carotid artery is within normal limits.  There is a focal calcified plaque along the posterior margin of the proximal right internal carotid artery without significant stenosis.  The right internal carotid artery is otherwise within normal limits.  The left common carotid artery is normal.  The bifurcation is unremarkable.  The left internal carotid artery is normal.  The soft tissues of the neck are unremarkable.  The thyroid is within normal limits.  Scattered small lymph nodes are present bilaterally.  No pathologically enlarged nodes are evident.  Mild mucosal thickening is noted in the maxillary sinuses and ethmoid air cells bilaterally. Flow  Mild dependent atelectasis is present in the visualized lungs. Mild degenerative changes are noted in the lower cervical spine without evidence for focal stenosis.   Review of the MIP images confirms the above findings.  IMPRESSION:  1.  Minimal atherosclerotic calcification along the posterior margin of the right internal carotid artery without significant stenosis. 2.  No other focal vascular disease in the neck. 3.  Mild sinus disease. .  CTA HEAD  Findings:  Bilateral cerebellar infarcts are again seen, more remote on the left.  No other acute ischemic regions are evident.  The distal right internal carotid artery demonstrates minimal calcification along the supraclinoid segment.  There is no significant stenosis.  Minimal calcifications are present within the cavernous left internal carotid artery without significant stenosis.  The A1 and M1 segments are normal.  The MCA bifurcation is within normal limits bilaterally. ACA and MCA branch vessels are unremarkable.  The right vertebral artery is the dominant vessel.  The PICA  origins are visualized and normal bilaterally.  The basilar artery is small.  Both posterior cerebral arteries originate from the posterior communicating arteries.  There is a small P1 segment on the right.  PCA branch vessels are normal bilaterally.   Review of the MIP images confirms the above findings.  IMPRESSION:  1.  Minimal distal right internal carotid artery calcification without focal stenosis. 2.  Normal variant posterior circulation with predominately fetal type posterior cerebral arteries bilaterally. 3.  Bilateral inferior cerebellar infarcts, more remote on the left.  Original Report Authenticated By: Jamesetta Orleans. MATTERN, M.D.   Ct Head Wo Contrast  02/28/2011  *RADIOLOGY REPORT*  Clinical Data: Headache.  Vertigo.  CT HEAD WITHOUT CONTRAST  Technique:  Contiguous axial images were obtained from the base of the skull through the vertex without contrast.  Comparison: None  Findings: The cerebral hemispheres are normal without evidence of atrophy, old or acute infarction, mass lesion, hemorrhage, hydrocephalus or extra-axial collection. With any cerebellar hemisphere, there is a small (sub-centimeter low density.  These raise the possibility of small cerebellar infarctions, age indeterminate.  I am not absolutely certain that they do not represent cerebellar folia, but the possibility of small cerebellar age indeterminate infarctions does exist.  The calvarium is unremarkable.  Sinuses, middle ears and mastoids are clear.  IMPRESSION: Cerebral hemispheres appear normal.  Question of single sub-centimeter low densities in each cerebellar hemisphere.  These could represent small cerebellar infarctions, age indeterminate.  I am not certain that they represent true pathology however.  Original Report Authenticated By: Thomasenia Sales, M.D.   Ct Angio Neck W/cm &/or Wo/cm  03/01/2011  *RADIOLOGY REPORT*  Clinical Data:  Cerebellar infarct.  CT ANGIOGRAPHY HEAD AND NECK  Technique:  Multidetector CT  imaging of the head and neck was performed using the standard protocol during bolus administration of intravenous contrast.  Multiplanar CT image reconstructions including MIPs were obtained to evaluate the vascular anatomy. Carotid stenosis measurements (when applicable) are obtained utilizing NASCET criteria, using the distal internal carotid diameter as the denominator.  Contrast: 50mL OMNIPAQUE IOHEXOL 350 MG/ML IV SOLN  Comparison:  MRI brain 02/28/2011.  CTA NECK  Findings:  There is a common origin of the left common carotid artery and the innominate artery.  No significant proximal stenosis is evident.  Both vertebral arteries originate from the subclavian arteries.  The right vertebral artery is slightly dominant to the left.  There are no focal stenoses within the neck.  The right common carotid artery is within normal limits.  There is a focal calcified plaque along the posterior margin of the proximal right internal carotid artery without significant stenosis.  The right internal carotid artery is otherwise within normal limits.  The left common carotid artery is normal.  The bifurcation is unremarkable.  The left internal carotid artery is normal.  The soft tissues of the neck are unremarkable.  The thyroid is within normal limits.  Scattered small lymph nodes are present bilaterally.  No pathologically enlarged nodes are evident.  Mild mucosal thickening is noted in the maxillary sinuses and ethmoid air cells bilaterally. Flow  Mild dependent atelectasis is present in the visualized lungs. Mild degenerative changes are noted in the lower cervical spine without evidence for focal stenosis.   Review of  the MIP images confirms the above findings.  IMPRESSION:  1.  Minimal atherosclerotic calcification along the posterior margin of the right internal carotid artery without significant stenosis. 2.  No other focal vascular disease in the neck. 3.  Mild sinus disease. .  CTA HEAD  Findings:  Bilateral  cerebellar infarcts are again seen, more remote on the left.  No other acute ischemic regions are evident.  The distal right internal carotid artery demonstrates minimal calcification along the supraclinoid segment.  There is no significant stenosis.  Minimal calcifications are present within the cavernous left internal carotid artery without significant stenosis.  The A1 and M1 segments are normal.  The MCA bifurcation is within normal limits bilaterally. ACA and MCA branch vessels are unremarkable.  The right vertebral artery is the dominant vessel.  The PICA origins are visualized and normal bilaterally.  The basilar artery is small.  Both posterior cerebral arteries originate from the posterior communicating arteries.  There is a small P1 segment on the right.  PCA branch vessels are normal bilaterally.   Review of the MIP images confirms the above findings.  IMPRESSION:  1.  Minimal distal right internal carotid artery calcification without focal stenosis. 2.  Normal variant posterior circulation with predominately fetal type posterior cerebral arteries bilaterally. 3.  Bilateral inferior cerebellar infarcts, more remote on the left.  Original Report Authenticated By: Jamesetta Orleans. MATTERN, M.D.   Mr Brain Wo Contrast  02/28/2011  *RADIOLOGY REPORT*  Clinical Data: 60 year old male with vertigo, possible stroke. Sudden onset of dizziness.  MRI HEAD WITHOUT CONTRAST  Technique:  Multiplanar, multiecho pulse sequences of the brain and surrounding structures were obtained according to standard protocol without intravenous contrast.  Comparison: Head CT from earlier the same day.  Findings: There is a confluent area of restricted diffusion in the inferior right cerebellar hemisphere tracking medially and superiorly.  There is mild associated T2 and FLAIR hyperintensity. No mass effect or hemorrhage.  There are superimposed chronic lacunar infarcts in the medial left cerebellar hemisphere.  Supratentorial  diffusion is within normal limits. Major intracranial vascular flow voids are preserved, including the distal right vertebral artery.  No midline shift, ventriculomegaly, mass effect, evidence of mass lesion, extra-axial collection or acute intracranial hemorrhage. Cervicomedullary junction and pituitary are within normal limits. Negative visualized cervical spine.  Wallace Cullens and white matter signal in the hemispheres, deep gray matter nuclei, and brain stem is normal for age. Visualized bone marrow signal is within normal limits.  Visualized orbit soft tissues are within normal limits.  Mild paranasal sinus mucosal thickening.  Mastoids are clear. Visualized internal auditory structures are grossly normal.  Metal susceptibility artifact related probably to dental hardware. Visualized scalp soft tissues are within normal limits.  IMPRESSION: 1.  Acute patchy right cerebellar infarct, mostly PICA territory. No mass effect or hemorrhage. 2.  Superimposed chronic left PICA cerebellar infarcts.  Original Report Authenticated By: Harley Hallmark, M.D.   Medications: Scheduled Meds:   . aspirin  300 mg Rectal Daily   Or  . aspirin  325 mg Oral Daily  . influenza  inactive virus vaccine  0.5 mL Intramuscular Tomorrow-1000  . lisinopril  10 mg Oral Daily  . simvastatin  20 mg Oral q1800  . sodium chloride  3 mL Intravenous Q12H  . solifenacin  10 mg Oral Daily   Continuous Infusions:  PRN Meds:.iohexol, senna-docusate, sodium chloride  Assessment/Plan: #1 Cerebellar CVA-Pt responding to treatment.Patient also tolerating physical therapy.Plan is to continue asa and  zocor. #2 Hypertension-BP STABLE #3 Hyperlipidemia-will continue zocor  LOS: 2 days   Jenicka Coxe 03/02/2011, 10:25 AM

## 2011-03-02 NOTE — Plan of Care (Signed)
Problem: Phase II Progression Outcomes Goal: Able to communicate Speech-language-cognition WFL's.  No f/u warranted  Royce Macadamia M.Ed [employer] [phone] 03/02/2011

## 2011-03-02 NOTE — Brief Op Note (Signed)
02/28/2011 - 03/02/2011  4:48 PM  PATIENT:  Christopher Sweeney  60 y.o. male  PRE-OPERATIVE DIAGNOSIS:  CVA  POST-OPERATIVE DIAGNOSIS:  * No post-op diagnosis entered *  PROCEDURE:  Procedure(s): TRANSESOPHAGEAL ECHOCARDIOGRAM (TEE)  Full report to follow

## 2011-03-02 NOTE — Interval H&P Note (Signed)
History and Physical Interval Note:   03/02/2011   4:50 PM   Christopher Sweeney  has presented today for surgery, with the diagnosis of CVA  The various methods of treatment have been discussed with the patient and family. After consideration of risks, benefits and other options for treatment, the patient has consented to  Procedure(s): TRANSESOPHAGEAL ECHOCARDIOGRAM (TEE) as a surgical intervention .  The patients' history has been reviewed, patient examined, no change in status, stable for surgery.  I have reviewed the patients' chart and labs.  Questions were answered to the patient's satisfaction.     Dietrich Pates  MD

## 2011-03-03 LAB — COMPREHENSIVE METABOLIC PANEL
ALT: 26 U/L (ref 0–53)
AST: 19 U/L (ref 0–37)
Albumin: 3.2 g/dL — ABNORMAL LOW (ref 3.5–5.2)
CO2: 27 mEq/L (ref 19–32)
Calcium: 8.7 mg/dL (ref 8.4–10.5)
GFR calc non Af Amer: 59 mL/min — ABNORMAL LOW (ref >90)
Sodium: 141 mEq/L (ref 135–145)
Total Protein: 7 g/dL (ref 6.0–8.3)

## 2011-03-03 LAB — CBC
MCH: 31 pg (ref 26.0–34.0)
Platelets: 232 10*3/uL (ref 150–400)
RBC: 4.29 MIL/uL (ref 4.22–5.81)
RDW: 13.4 % (ref 11.5–15.5)

## 2011-03-03 NOTE — Progress Notes (Signed)
Subjective: Patient seen.Fells better.Denies any blurry vision or vertiginous symptoms  Objective: Weight change:   Intake/Output Summary (Last 24 hours) at 03/03/11 1306 Last data filed at 03/03/11 0900  Gross per 24 hour  Intake    640 ml  Output      0 ml  Net    640 ml   BP 100/66  Pulse 63  Temp(Src) 98.1 F (36.7 C) (Oral)  Resp 20  Ht 5\' 9"  (1.753 m)  Wt 74.4 kg (164 lb 0.4 oz)  BMI 24.22 kg/m2  SpO2 99% Physical Exam: General appearance: alert, cooperative and no distress Head: Normocephalic, without obvious abnormality, atraumatic Neck: no adenopathy, no carotid bruit, no JVD, supple, symmetrical, trachea midline and thyroid not enlarged, symmetric, no tenderness/mass/nodules Lungs: clear to auscultation bilaterally Heart: regular rate and rhythm, S1, S2 normal, no murmur, click, rub or gallop Abdomen: soft, non-tender; bowel sounds normal; no masses,  no organomegaly Extremities: extremities normal, atraumatic, no cyanosis or edema Skin: Skin color, texture, turgor normal. No rashes or lesions Neuro-non focal  Lab Results: @labtest @  Micro Results: No results found for this or any previous visit (from the past 240 hour(s)).  Studies/Results: Ct Angio Head W/cm &/or Wo Cm  03/01/2011  *RADIOLOGY REPORT*  Clinical Data:  Cerebellar infarct.  CT ANGIOGRAPHY HEAD AND NECK  Technique:  Multidetector CT imaging of the head and neck was performed using the standard protocol during bolus administration of intravenous contrast.  Multiplanar CT image reconstructions including MIPs were obtained to evaluate the vascular anatomy. Carotid stenosis measurements (when applicable) are obtained utilizing NASCET criteria, using the distal internal carotid diameter as the denominator.  Contrast: 50mL OMNIPAQUE IOHEXOL 350 MG/ML IV SOLN  Comparison:  MRI brain 02/28/2011.  CTA NECK  Findings:  There is a common origin of the left common carotid artery and the innominate artery.  No  significant proximal stenosis is evident.  Both vertebral arteries originate from the subclavian arteries.  The right vertebral artery is slightly dominant to the left.  There are no focal stenoses within the neck.  The right common carotid artery is within normal limits.  There is a focal calcified plaque along the posterior margin of the proximal right internal carotid artery without significant stenosis.  The right internal carotid artery is otherwise within normal limits.  The left common carotid artery is normal.  The bifurcation is unremarkable.  The left internal carotid artery is normal.  The soft tissues of the neck are unremarkable.  The thyroid is within normal limits.  Scattered small lymph nodes are present bilaterally.  No pathologically enlarged nodes are evident.  Mild mucosal thickening is noted in the maxillary sinuses and ethmoid air cells bilaterally. Flow  Mild dependent atelectasis is present in the visualized lungs. Mild degenerative changes are noted in the lower cervical spine without evidence for focal stenosis.   Review of the MIP images confirms the above findings.  IMPRESSION:  1.  Minimal atherosclerotic calcification along the posterior margin of the right internal carotid artery without significant stenosis. 2.  No other focal vascular disease in the neck. 3.  Mild sinus disease. .  CTA HEAD  Findings:  Bilateral cerebellar infarcts are again seen, more remote on the left.  No other acute ischemic regions are evident.  The distal right internal carotid artery demonstrates minimal calcification along the supraclinoid segment.  There is no significant stenosis.  Minimal calcifications are present within the cavernous left internal carotid artery without significant stenosis.  The A1 and M1 segments are normal.  The MCA bifurcation is within normal limits bilaterally. ACA and MCA branch vessels are unremarkable.  The right vertebral artery is the dominant vessel.  The PICA origins are  visualized and normal bilaterally.  The basilar artery is small.  Both posterior cerebral arteries originate from the posterior communicating arteries.  There is a small P1 segment on the right.  PCA branch vessels are normal bilaterally.   Review of the MIP images confirms the above findings.  IMPRESSION:  1.  Minimal distal right internal carotid artery calcification without focal stenosis. 2.  Normal variant posterior circulation with predominately fetal type posterior cerebral arteries bilaterally. 3.  Bilateral inferior cerebellar infarcts, more remote on the left.  Original Report Authenticated By: Jamesetta Orleans. MATTERN, M.D.   Ct Head Wo Contrast  02/28/2011  *RADIOLOGY REPORT*  Clinical Data: Headache.  Vertigo.  CT HEAD WITHOUT CONTRAST  Technique:  Contiguous axial images were obtained from the base of the skull through the vertex without contrast.  Comparison: None  Findings: The cerebral hemispheres are normal without evidence of atrophy, old or acute infarction, mass lesion, hemorrhage, hydrocephalus or extra-axial collection. With any cerebellar hemisphere, there is a small (sub-centimeter low density.  These raise the possibility of small cerebellar infarctions, age indeterminate.  I am not absolutely certain that they do not represent cerebellar folia, but the possibility of small cerebellar age indeterminate infarctions does exist.  The calvarium is unremarkable.  Sinuses, middle ears and mastoids are clear.  IMPRESSION: Cerebral hemispheres appear normal.  Question of single sub-centimeter low densities in each cerebellar hemisphere.  These could represent small cerebellar infarctions, age indeterminate.  I am not certain that they represent true pathology however.  Original Report Authenticated By: Thomasenia Sales, M.D.   Ct Angio Neck W/cm &/or Wo/cm  03/01/2011  *RADIOLOGY REPORT*  Clinical Data:  Cerebellar infarct.  CT ANGIOGRAPHY HEAD AND NECK  Technique:  Multidetector CT imaging of  the head and neck was performed using the standard protocol during bolus administration of intravenous contrast.  Multiplanar CT image reconstructions including MIPs were obtained to evaluate the vascular anatomy. Carotid stenosis measurements (when applicable) are obtained utilizing NASCET criteria, using the distal internal carotid diameter as the denominator.  Contrast: 50mL OMNIPAQUE IOHEXOL 350 MG/ML IV SOLN  Comparison:  MRI brain 02/28/2011.  CTA NECK  Findings:  There is a common origin of the left common carotid artery and the innominate artery.  No significant proximal stenosis is evident.  Both vertebral arteries originate from the subclavian arteries.  The right vertebral artery is slightly dominant to the left.  There are no focal stenoses within the neck.  The right common carotid artery is within normal limits.  There is a focal calcified plaque along the posterior margin of the proximal right internal carotid artery without significant stenosis.  The right internal carotid artery is otherwise within normal limits.  The left common carotid artery is normal.  The bifurcation is unremarkable.  The left internal carotid artery is normal.  The soft tissues of the neck are unremarkable.  The thyroid is within normal limits.  Scattered small lymph nodes are present bilaterally.  No pathologically enlarged nodes are evident.  Mild mucosal thickening is noted in the maxillary sinuses and ethmoid air cells bilaterally. Flow  Mild dependent atelectasis is present in the visualized lungs. Mild degenerative changes are noted in the lower cervical spine without evidence for focal stenosis.   Review of  the MIP images confirms the above findings.  IMPRESSION:  1.  Minimal atherosclerotic calcification along the posterior margin of the right internal carotid artery without significant stenosis. 2.  No other focal vascular disease in the neck. 3.  Mild sinus disease. .  CTA HEAD  Findings:  Bilateral cerebellar  infarcts are again seen, more remote on the left.  No other acute ischemic regions are evident.  The distal right internal carotid artery demonstrates minimal calcification along the supraclinoid segment.  There is no significant stenosis.  Minimal calcifications are present within the cavernous left internal carotid artery without significant stenosis.  The A1 and M1 segments are normal.  The MCA bifurcation is within normal limits bilaterally. ACA and MCA branch vessels are unremarkable.  The right vertebral artery is the dominant vessel.  The PICA origins are visualized and normal bilaterally.  The basilar artery is small.  Both posterior cerebral arteries originate from the posterior communicating arteries.  There is a small P1 segment on the right.  PCA branch vessels are normal bilaterally.   Review of the MIP images confirms the above findings.  IMPRESSION:  1.  Minimal distal right internal carotid artery calcification without focal stenosis. 2.  Normal variant posterior circulation with predominately fetal type posterior cerebral arteries bilaterally. 3.  Bilateral inferior cerebellar infarcts, more remote on the left.  Original Report Authenticated By: Jamesetta Orleans. MATTERN, M.D.   Mr Brain Wo Contrast  02/28/2011  *RADIOLOGY REPORT*  Clinical Data: 60 year old male with vertigo, possible stroke. Sudden onset of dizziness.  MRI HEAD WITHOUT CONTRAST  Technique:  Multiplanar, multiecho pulse sequences of the brain and surrounding structures were obtained according to standard protocol without intravenous contrast.  Comparison: Head CT from earlier the same day.  Findings: There is a confluent area of restricted diffusion in the inferior right cerebellar hemisphere tracking medially and superiorly.  There is mild associated T2 and FLAIR hyperintensity. No mass effect or hemorrhage.  There are superimposed chronic lacunar infarcts in the medial left cerebellar hemisphere.  Supratentorial diffusion is  within normal limits. Major intracranial vascular flow voids are preserved, including the distal right vertebral artery.  No midline shift, ventriculomegaly, mass effect, evidence of mass lesion, extra-axial collection or acute intracranial hemorrhage. Cervicomedullary junction and pituitary are within normal limits. Negative visualized cervical spine.  Wallace Cullens and white matter signal in the hemispheres, deep gray matter nuclei, and brain stem is normal for age. Visualized bone marrow signal is within normal limits.  Visualized orbit soft tissues are within normal limits.  Mild paranasal sinus mucosal thickening.  Mastoids are clear. Visualized internal auditory structures are grossly normal.  Metal susceptibility artifact related probably to dental hardware. Visualized scalp soft tissues are within normal limits.  IMPRESSION: 1.  Acute patchy right cerebellar infarct, mostly PICA territory. No mass effect or hemorrhage. 2.  Superimposed chronic left PICA cerebellar infarcts.  Original Report Authenticated By: Harley Hallmark, M.D.   Medications: Scheduled Meds:   . aspirin  300 mg Rectal Daily   Or  . aspirin  325 mg Oral Daily  . fentaNYL  250 mcg Intravenous Once  . influenza  inactive virus vaccine  0.5 mL Intramuscular ONCE-1800  . lisinopril  10 mg Oral Daily  . midazolam  10 mg Intravenous Once  . simvastatin  20 mg Oral q1800  . sodium chloride  3 mL Intravenous Q12H  . sodium chloride  3 mL Intravenous Q12H  . solifenacin  10 mg Oral Daily  .  DISCONTD: influenza  inactive virus vaccine  0.5 mL Intramuscular Tomorrow-1000  . DISCONTD: solifenacin  10 mg Oral Daily   Continuous Infusions:   . sodium chloride 250 mL (03/02/11 1511)   PRN Meds:.benzocaine, senna-docusate, sodium chloride, sodium chloride, DISCONTD: fentaNYL, DISCONTD: lidocaine, DISCONTD: midazolam  Assessment/Plan: #1 Acute cerebellar CVA-Symptoms resolving.Patient to continue asa #2 Hypertension-Bp stable #3  hyperlipidemia-continue zocor For d/c home on 03/04/2011  LOS: 3 days   Salman Wellen 03/03/2011, 1:06 PM

## 2011-03-03 NOTE — Consult Note (Signed)
Subjective: The patient indicates that these symptoms have essentially resolved. The patient denies dizziness, headache, or problems with coordination. The patient has been ambulating without assistance. Objective: Vital signs in last 24 hours: Temp:  [97.4 F (36.3 C)-98.5 F (36.9 C)] 98.5 F (36.9 C) (11/17 1357) Pulse Rate:  [63-81] 79  (11/17 1357) Resp:  [13-28] 18  (11/17 1357) BP: (98-143)/(59-92) 121/75 mmHg (11/17 1357) SpO2:  [96 %-100 %] 98 % (11/17 1357) FiO2 (%):  [2 %] 2 % (11/16 1700) Weight change:  Last BM Date: 03/02/11  Intake/Output from previous day: 11/16 0701 - 11/17 0700 In: 760 [P.O.:360; I.V.:400] Out: 5 [Urine:4; Stool:1] Intake/Output this shift: Total I/O In: 240 [P.O.:240] Out: -   The patient is alert and cooperative.  Neurologic exam reveals full extraocular movements, speech is normal. Visual fields are full.  Motor testing reveals good strength of all four extremities.  The patient has good finger-nose-finger and heel-to-shin bilaterally. Gait was not tested.  Deep tendon reflexes are symmetric and normal. Toes are down going bilaterally.    Lab Results:  [facility] 03/03/11 0630 02/28/11 1957  WBC 4.1 6.9  HGB 13.3 15.8  HCT 39.1 44.1  PLT 232 276   BMET  Basename 03/03/11 0630 02/28/11 1957  NA 141 135  K 3.8 4.6  CL 105 99  CO2 27 25  GLUCOSE 88 91  BUN 14 12  CREATININE 1.28 1.09  CALCIUM 8.7 9.9    Studies/Results: No results found.  Medications:  Scheduled:   . aspirin  300 mg Rectal Daily   Or  . aspirin  325 mg Oral Daily  . fentaNYL  250 mcg Intravenous Once  . influenza  inactive virus vaccine  0.5 mL Intramuscular ONCE-1800  . lisinopril  10 mg Oral Daily  . midazolam  10 mg Intravenous Once  . simvastatin  20 mg Oral q1800  . sodium chloride  3 mL Intravenous Q12H  . sodium chloride  3 mL Intravenous Q12H  . solifenacin  10 mg Oral Daily  . DISCONTD: influenza  inactive virus vaccine  0.5 mL  Intramuscular Tomorrow-1000   Continuous:   . sodium chloride 250 mL (03/02/11 1511)   ZOX:[id number], senna-docusate, sodium chloride, sodium chloride, DISCONTD: fentaNYL, DISCONTD: lidocaine, DISCONTD: midazolam TEE was done, and was unremarkable. CT angio of the brain: 1. Minimal distal right internal carotid artery calcification without focal stenosis. 2. Normal variant posterior circulation with predominately fetal type posterior cerebral arteries bilaterally. 3. Bilateral inferior cerebellar infarcts, more remote on the left.  CT angio of the neck: 1. Minimal atherosclerotic calcification along the posterior margin of the right internal carotid artery without significant stenosis. 2. No other focal vascular disease in the neck. 3. Mild sinus disease. Marland Kitchen  MRI of the brain: 1. Acute patchy right cerebellar infarct, mostly PICA territory. No mass effect or hemorrhage. 2. Superimposed chronic left PICA cerebellar infarcts.  2D Echocardiogram: EF 55-60% with no source of embolus.  EKG: normal sinus rhythm.   Assessment/Plan: 1. Right cerebellar infarct, etiology unclear  2. Mild hypertension  The patient has essentially returned back to baseline with his clinical status. The patient is now ambulatory, and denies any further vertigo, or ataxia. The patient may be discharged soon to the home environment, taking aspirin. A prolonged CardioNet monitoring study is recommended. The patient will followup with Dr. Pearlean Brownie within 2 months following discharge.  LOS: 3 days   Aylanie Cubillos KEITH 03/03/2011, 3:04 PM

## 2011-03-04 MED ORDER — ASPIRIN 325 MG PO TABS: 325.0000 mg | ORAL_TABLET | Freq: Every day | ORAL | Status: AC

## 2011-03-04 MED ORDER — LISINOPRIL 10 MG PO TABS: 10.0000 mg | ORAL_TABLET | Freq: Every day | ORAL | Status: DC

## 2011-03-04 MED ORDER — SIMVASTATIN 20 MG PO TABS: 20.0000 mg | ORAL_TABLET | Freq: Every day | ORAL | Status: DC

## 2011-03-04 NOTE — Discharge Summary (Signed)
  DISCHARGE SUMMARY Patient is a 60 year old African American male with history of hypertension and hyperlipidemia was initially seen at [facility] by Dr. Eldridge Dace on 02/28/2011 with complains of dizzy spells and vertiginous symptoms. He also complained about headaches. He denied any slurred speech. He denied any chest pain, shortness of breath, and diaphoresis. He ever complain of unsteady gait. He denied any fever chills or rigors. Imaging done include ct brain and it showed infarcts at the level of the cerebellum and subsequently patient was transferred to a [facility] [place] for further evaluation and stabilization.  Christopher Sweeney  MR#: [id number]  DOB:Feb 27, 1951  Date of Admission: 02/28/2011 Date of Discharge: 03/04/2011  Attending Physician:Dafne Nield  Patient's PCP:No primary provider on file.  Consults: NEUROLOGY  Discharge Diagnoses: #1 Acute cerebellar CVA #2 hypertension #3 hyperlipidemia #4 questionable BPH.    Current Discharge Medication List    START taking these medications   Details  aspirin 325 MG tablet Take 1 tablet (325 mg total) by mouth daily. Qty: 30 tablet, Refills: 1    lisinopril (PRINIVIL,ZESTRIL) 10 MG tablet Take 1 tablet (10 mg total) by mouth daily. Qty: 30 tablet, Refills: 1    simvastatin (ZOCOR) 20 MG tablet Take 1 tablet (20 mg total) by mouth daily at 6 PM. Qty: 30 tablet, Refills: 1      CONTINUE these medications which have NOT CHANGED   Details  solifenacin (VESICARE) 10 MG tablet Take 10 mg by mouth daily.       STOP taking these medications     pravastatin (PRAVACHOL) 40 MG tablet           Hospital Course: Patient was initially seen at [facility] He was then transferred to [facility]. He was admitted to telemetry floor and started on aspirin 325mg   by mouth daily. On the medication given to patient include Zocor as well as lisinopril he was evaluated but it has neurologist who  ordered imaging studies CT angiogram of head was essentially unremarkable patient also had a TEE to rule out PFO and also carotid duplex. Patient's headache was controlled with fentanyl injection and this resolved. He had physical therapy done and the vertiginous symptoms resolved. So far patient has remained clinically stable and examination of patient didn not show any neuro lateralising signs and the plan is for patient to be discharged today. Follow with the neurologist in 2 months.   Day of Discharge BP 105/68  Pulse 80  Temp(Src) 98.3 F (36.8 C) (Oral)  Resp 18  Ht 5\' 9"  (1.753 m)  Wt 74.4 kg (164 lb 0.4 oz)  BMI 24.22 kg/m2  SpO2 98%  Physical Exam: HEENT-mild pallor NECK-no jvd CHEST-clear CVS-s1 and s2 ABD-soft,non tender,organs not palpable and bowel sounds present. EXT-no pedal edema NEURO-no lateralising signs SKIN-no ecchymoses  No results found for this or any previous visit (from the past 24 hour(s)).  Disposition: stable   Follow-up Appts: Discharge Orders    Future Orders Please Complete By Expires   Diet - low sodium heart healthy      Increase activity slowly      Discharge instructions      Comments:   Follow up with his primary care physician in 2 weeks and the neurologist Dr Pearlean Brownie in 2months      Signed: Shaquanta Harkless 03/04/2011, 11:09 AM

## 2011-03-13 ENCOUNTER — Encounter (HOSPITAL_COMMUNITY): Payer: Self-pay | Admitting: Internal Medicine

## 2011-03-20 NOTE — Op Note (Signed)
Full report in [facility] 

## 2013-03-27 ENCOUNTER — Ambulatory Visit (INDEPENDENT_AMBULATORY_CARE_PROVIDER_SITE_OTHER): Payer: Self-pay | Admitting: *Deleted

## 2013-03-27 DIAGNOSIS — I6350 Cerebral infarction due to unspecified occlusion or stenosis of unspecified cerebral artery: Secondary | ICD-10-CM

## 2013-03-27 DIAGNOSIS — I639 Cerebral infarction, unspecified: Secondary | ICD-10-CM

## 2013-03-30 NOTE — Progress Notes (Signed)
Participant in the office today for his 2nd. Annual Study visit for the IRIS Study. MMSE was completed successfully without problems.  Blood was drawn and shipped.  Participant will continue with follow-up visits per IRIS protocol. Participant started new bottle of study drug on 03/27/2013.

## 2014-08-12 ENCOUNTER — Institutional Professional Consult (permissible substitution): Payer: 59 | Admitting: Neurology

## 2015-07-21 DIAGNOSIS — I10 Essential (primary) hypertension: Secondary | ICD-10-CM | POA: Insufficient documentation

## 2015-07-21 DIAGNOSIS — E785 Hyperlipidemia, unspecified: Secondary | ICD-10-CM | POA: Insufficient documentation

## 2016-01-18 DIAGNOSIS — E782 Mixed hyperlipidemia: Secondary | ICD-10-CM | POA: Diagnosis not present

## 2016-01-18 DIAGNOSIS — I639 Cerebral infarction, unspecified: Secondary | ICD-10-CM | POA: Diagnosis not present

## 2016-01-18 DIAGNOSIS — C61 Malignant neoplasm of prostate: Secondary | ICD-10-CM | POA: Diagnosis not present

## 2016-01-18 DIAGNOSIS — I10 Essential (primary) hypertension: Secondary | ICD-10-CM | POA: Diagnosis not present

## 2016-02-06 DIAGNOSIS — K056 Periodontal disease, unspecified: Secondary | ICD-10-CM | POA: Diagnosis not present

## 2016-02-06 DIAGNOSIS — K069 Disorder of gingiva and edentulous alveolar ridge, unspecified: Secondary | ICD-10-CM | POA: Diagnosis not present

## 2016-02-06 DIAGNOSIS — K047 Periapical abscess without sinus: Secondary | ICD-10-CM | POA: Diagnosis not present

## 2016-02-08 DIAGNOSIS — E782 Mixed hyperlipidemia: Secondary | ICD-10-CM | POA: Diagnosis not present

## 2016-02-08 DIAGNOSIS — C61 Malignant neoplasm of prostate: Secondary | ICD-10-CM | POA: Diagnosis not present

## 2016-02-08 DIAGNOSIS — I10 Essential (primary) hypertension: Secondary | ICD-10-CM | POA: Diagnosis not present

## 2016-02-08 DIAGNOSIS — I639 Cerebral infarction, unspecified: Secondary | ICD-10-CM | POA: Diagnosis not present

## 2016-02-17 DIAGNOSIS — H5203 Hypermetropia, bilateral: Secondary | ICD-10-CM | POA: Diagnosis not present

## 2016-02-17 DIAGNOSIS — H52223 Regular astigmatism, bilateral: Secondary | ICD-10-CM | POA: Diagnosis not present

## 2016-02-17 DIAGNOSIS — H524 Presbyopia: Secondary | ICD-10-CM | POA: Diagnosis not present

## 2016-05-08 DIAGNOSIS — I10 Essential (primary) hypertension: Secondary | ICD-10-CM | POA: Diagnosis not present

## 2016-05-08 DIAGNOSIS — I639 Cerebral infarction, unspecified: Secondary | ICD-10-CM | POA: Diagnosis not present

## 2016-05-08 DIAGNOSIS — E782 Mixed hyperlipidemia: Secondary | ICD-10-CM | POA: Diagnosis not present

## 2016-05-09 DIAGNOSIS — C61 Malignant neoplasm of prostate: Secondary | ICD-10-CM | POA: Diagnosis not present

## 2016-05-09 DIAGNOSIS — I10 Essential (primary) hypertension: Secondary | ICD-10-CM | POA: Diagnosis not present

## 2016-05-09 DIAGNOSIS — E782 Mixed hyperlipidemia: Secondary | ICD-10-CM | POA: Diagnosis not present

## 2016-05-09 DIAGNOSIS — I639 Cerebral infarction, unspecified: Secondary | ICD-10-CM | POA: Diagnosis not present

## 2016-08-13 DIAGNOSIS — E782 Mixed hyperlipidemia: Secondary | ICD-10-CM | POA: Diagnosis not present

## 2016-08-13 DIAGNOSIS — C61 Malignant neoplasm of prostate: Secondary | ICD-10-CM | POA: Diagnosis not present

## 2016-08-13 DIAGNOSIS — I639 Cerebral infarction, unspecified: Secondary | ICD-10-CM | POA: Diagnosis not present

## 2016-08-13 DIAGNOSIS — I10 Essential (primary) hypertension: Secondary | ICD-10-CM | POA: Diagnosis not present

## 2016-12-03 DIAGNOSIS — R109 Unspecified abdominal pain: Secondary | ICD-10-CM | POA: Diagnosis not present

## 2016-12-03 DIAGNOSIS — Z0000 Encounter for general adult medical examination without abnormal findings: Secondary | ICD-10-CM | POA: Diagnosis not present

## 2016-12-10 ENCOUNTER — Other Ambulatory Visit: Payer: Self-pay | Admitting: Family Medicine

## 2016-12-10 DIAGNOSIS — R195 Other fecal abnormalities: Secondary | ICD-10-CM

## 2016-12-10 DIAGNOSIS — E782 Mixed hyperlipidemia: Secondary | ICD-10-CM | POA: Diagnosis not present

## 2016-12-10 DIAGNOSIS — C61 Malignant neoplasm of prostate: Secondary | ICD-10-CM | POA: Diagnosis not present

## 2016-12-10 DIAGNOSIS — R109 Unspecified abdominal pain: Secondary | ICD-10-CM

## 2016-12-10 DIAGNOSIS — I10 Essential (primary) hypertension: Secondary | ICD-10-CM | POA: Diagnosis not present

## 2016-12-10 DIAGNOSIS — I639 Cerebral infarction, unspecified: Secondary | ICD-10-CM | POA: Diagnosis not present

## 2016-12-11 ENCOUNTER — Ambulatory Visit
Admission: RE | Admit: 2016-12-11 | Discharge: 2016-12-11 | Disposition: A | Discharge status: Home / Self Care | Payer: Medicare HMO | Source: Ambulatory Visit | Attending: Family Medicine | Admitting: Family Medicine

## 2016-12-11 DIAGNOSIS — R195 Other fecal abnormalities: Secondary | ICD-10-CM

## 2016-12-11 DIAGNOSIS — R109 Unspecified abdominal pain: Secondary | ICD-10-CM

## 2016-12-11 MED ORDER — IOPAMIDOL (ISOVUE-300) INJECTION 61%
100.0000 mL | Freq: Once | INTRAVENOUS | Status: AC | PRN
  Administered 2016-12-11: 100 mL via INTRAVENOUS

## 2016-12-13 DIAGNOSIS — E782 Mixed hyperlipidemia: Secondary | ICD-10-CM | POA: Diagnosis not present

## 2016-12-13 DIAGNOSIS — I639 Cerebral infarction, unspecified: Secondary | ICD-10-CM | POA: Diagnosis not present

## 2016-12-13 DIAGNOSIS — R109 Unspecified abdominal pain: Secondary | ICD-10-CM | POA: Diagnosis not present

## 2016-12-13 DIAGNOSIS — I10 Essential (primary) hypertension: Secondary | ICD-10-CM | POA: Diagnosis not present

## 2017-04-15 DIAGNOSIS — C61 Malignant neoplasm of prostate: Secondary | ICD-10-CM | POA: Diagnosis not present

## 2017-04-15 DIAGNOSIS — I10 Essential (primary) hypertension: Secondary | ICD-10-CM | POA: Diagnosis not present

## 2017-04-15 DIAGNOSIS — E782 Mixed hyperlipidemia: Secondary | ICD-10-CM | POA: Diagnosis not present

## 2017-04-15 DIAGNOSIS — I639 Cerebral infarction, unspecified: Secondary | ICD-10-CM | POA: Diagnosis not present

## 2017-08-02 DIAGNOSIS — H5203 Hypermetropia, bilateral: Secondary | ICD-10-CM | POA: Diagnosis not present

## 2017-08-02 DIAGNOSIS — H524 Presbyopia: Secondary | ICD-10-CM | POA: Diagnosis not present

## 2017-08-02 DIAGNOSIS — H52223 Regular astigmatism, bilateral: Secondary | ICD-10-CM | POA: Diagnosis not present

## 2017-08-16 DIAGNOSIS — E782 Mixed hyperlipidemia: Secondary | ICD-10-CM | POA: Diagnosis not present

## 2017-08-16 DIAGNOSIS — S46111A Strain of muscle, fascia and tendon of long head of biceps, right arm, initial encounter: Secondary | ICD-10-CM | POA: Diagnosis not present

## 2017-08-16 DIAGNOSIS — I10 Essential (primary) hypertension: Secondary | ICD-10-CM | POA: Diagnosis not present

## 2017-08-27 DIAGNOSIS — I639 Cerebral infarction, unspecified: Secondary | ICD-10-CM | POA: Diagnosis not present

## 2017-08-27 DIAGNOSIS — I10 Essential (primary) hypertension: Secondary | ICD-10-CM | POA: Diagnosis not present

## 2017-08-27 DIAGNOSIS — S46299A Other injury of muscle, fascia and tendon of other parts of biceps, unspecified arm, initial encounter: Secondary | ICD-10-CM | POA: Diagnosis not present

## 2017-08-27 DIAGNOSIS — E782 Mixed hyperlipidemia: Secondary | ICD-10-CM | POA: Diagnosis not present

## 2017-09-02 DIAGNOSIS — M25511 Pain in right shoulder: Secondary | ICD-10-CM | POA: Diagnosis not present

## 2017-09-05 DIAGNOSIS — M25511 Pain in right shoulder: Secondary | ICD-10-CM | POA: Diagnosis not present

## 2017-09-11 DIAGNOSIS — M25511 Pain in right shoulder: Secondary | ICD-10-CM | POA: Diagnosis not present

## 2017-09-13 DIAGNOSIS — M25511 Pain in right shoulder: Secondary | ICD-10-CM | POA: Diagnosis not present

## 2017-09-18 DIAGNOSIS — M25511 Pain in right shoulder: Secondary | ICD-10-CM | POA: Diagnosis not present

## 2017-09-19 DIAGNOSIS — S46299D Other injury of muscle, fascia and tendon of other parts of biceps, unspecified arm, subsequent encounter: Secondary | ICD-10-CM | POA: Diagnosis not present

## 2017-09-23 DIAGNOSIS — M25511 Pain in right shoulder: Secondary | ICD-10-CM | POA: Diagnosis not present

## 2017-09-25 DIAGNOSIS — M25511 Pain in right shoulder: Secondary | ICD-10-CM | POA: Diagnosis not present

## 2017-09-30 DIAGNOSIS — M25511 Pain in right shoulder: Secondary | ICD-10-CM | POA: Diagnosis not present

## 2017-10-15 DIAGNOSIS — M25511 Pain in right shoulder: Secondary | ICD-10-CM | POA: Diagnosis not present

## 2017-11-01 DIAGNOSIS — I10 Essential (primary) hypertension: Secondary | ICD-10-CM | POA: Diagnosis not present

## 2017-11-01 DIAGNOSIS — S46299D Other injury of muscle, fascia and tendon of other parts of biceps, unspecified arm, subsequent encounter: Secondary | ICD-10-CM | POA: Diagnosis not present

## 2017-11-01 DIAGNOSIS — Z6825 Body mass index (BMI) 25.0-25.9, adult: Secondary | ICD-10-CM | POA: Diagnosis not present

## 2017-11-06 DIAGNOSIS — M67911 Unspecified disorder of synovium and tendon, right shoulder: Secondary | ICD-10-CM | POA: Diagnosis not present

## 2018-01-01 DIAGNOSIS — I639 Cerebral infarction, unspecified: Secondary | ICD-10-CM | POA: Diagnosis not present

## 2018-01-01 DIAGNOSIS — I10 Essential (primary) hypertension: Secondary | ICD-10-CM | POA: Diagnosis not present

## 2018-03-05 ENCOUNTER — Emergency Department (HOSPITAL_COMMUNITY)
Admission: EM | Admit: 2018-03-05 | Discharge: 2018-03-05 | Disposition: A | Discharge status: Other Acute Inpatient Hospital | Payer: Medicare HMO

## 2018-03-06 ENCOUNTER — Emergency Department (HOSPITAL_COMMUNITY): Payer: Medicare HMO

## 2018-03-06 ENCOUNTER — Encounter (HOSPITAL_COMMUNITY): Payer: Self-pay

## 2018-03-06 ENCOUNTER — Other Ambulatory Visit: Payer: Self-pay | Admitting: Family Medicine

## 2018-03-06 ENCOUNTER — Other Ambulatory Visit: Payer: Self-pay

## 2018-03-06 ENCOUNTER — Emergency Department (HOSPITAL_COMMUNITY)
Admission: EM | Admit: 2018-03-06 | Discharge: 2018-03-06 | Disposition: A | Discharge status: Home / Self Care | Payer: Medicare HMO | Attending: Emergency Medicine | Admitting: Emergency Medicine

## 2018-03-06 DIAGNOSIS — Z7982 Long term (current) use of aspirin: Secondary | ICD-10-CM | POA: Insufficient documentation

## 2018-03-06 DIAGNOSIS — N189 Chronic kidney disease, unspecified: Secondary | ICD-10-CM | POA: Insufficient documentation

## 2018-03-06 DIAGNOSIS — I129 Hypertensive chronic kidney disease with stage 1 through stage 4 chronic kidney disease, or unspecified chronic kidney disease: Secondary | ICD-10-CM | POA: Insufficient documentation

## 2018-03-06 DIAGNOSIS — G464 Cerebellar stroke syndrome: Secondary | ICD-10-CM | POA: Diagnosis not present

## 2018-03-06 DIAGNOSIS — R51 Headache: Secondary | ICD-10-CM | POA: Diagnosis not present

## 2018-03-06 DIAGNOSIS — M6281 Muscle weakness (generalized): Secondary | ICD-10-CM | POA: Diagnosis not present

## 2018-03-06 DIAGNOSIS — R531 Weakness: Secondary | ICD-10-CM | POA: Diagnosis not present

## 2018-03-06 DIAGNOSIS — I639 Cerebral infarction, unspecified: Secondary | ICD-10-CM

## 2018-03-06 DIAGNOSIS — Z79899 Other long term (current) drug therapy: Secondary | ICD-10-CM | POA: Insufficient documentation

## 2018-03-06 DIAGNOSIS — R42 Dizziness and giddiness: Secondary | ICD-10-CM | POA: Diagnosis not present

## 2018-03-06 DIAGNOSIS — Z0000 Encounter for general adult medical examination without abnormal findings: Secondary | ICD-10-CM | POA: Diagnosis not present

## 2018-03-06 LAB — COMPREHENSIVE METABOLIC PANEL
ALK PHOS: 57 U/L (ref 38–126)
ALT: 29 U/L (ref 0–44)
AST: 31 U/L (ref 15–41)
Albumin: 4.4 g/dL (ref 3.5–5.0)
Anion gap: 11 (ref 5–15)
BILIRUBIN TOTAL: 1 mg/dL (ref 0.3–1.2)
BUN: 13 mg/dL (ref 8–23)
CALCIUM: 9.4 mg/dL (ref 8.9–10.3)
CO2: 21 mmol/L — AB (ref 22–32)
Chloride: 107 mmol/L (ref 98–111)
Creatinine, Ser: 1.41 mg/dL — ABNORMAL HIGH (ref 0.61–1.24)
GFR calc non Af Amer: 50 mL/min — ABNORMAL LOW (ref >60)
GFR, EST AFRICAN AMERICAN: 58 mL/min — AB (ref >60)
Glucose, Bld: 82 mg/dL (ref 70–99)
POTASSIUM: 3.8 mmol/L (ref 3.5–5.1)
SODIUM: 139 mmol/L (ref 135–145)
TOTAL PROTEIN: 8.4 g/dL — AB (ref 6.5–8.1)

## 2018-03-06 LAB — CBC WITH DIFFERENTIAL/PLATELET
ABS IMMATURE GRANULOCYTES: 0.01 10*3/uL (ref 0.00–0.07)
Basophils Absolute: 0 10*3/uL (ref 0.0–0.1)
Basophils Relative: 1 %
EOS ABS: 0.1 10*3/uL (ref 0.0–0.5)
EOS PCT: 3 %
HEMATOCRIT: 47.1 % (ref 39.0–52.0)
HEMOGLOBIN: 15.5 g/dL (ref 13.0–17.0)
Immature Granulocytes: 0 %
Lymphocytes Relative: 47 %
Lymphs Abs: 2.4 10*3/uL (ref 0.7–4.0)
MCH: 30.8 pg (ref 26.0–34.0)
MCHC: 32.9 g/dL (ref 30.0–36.0)
MCV: 93.5 fL (ref 80.0–100.0)
Monocytes Absolute: 0.7 10*3/uL (ref 0.1–1.0)
Monocytes Relative: 14 %
NEUTROS PCT: 35 %
Neutro Abs: 1.7 10*3/uL (ref 1.7–7.7)
Platelets: 254 10*3/uL (ref 150–400)
RBC: 5.04 MIL/uL (ref 4.22–5.81)
RDW: 12.9 % (ref 11.5–15.5)
WBC: 5 10*3/uL (ref 4.0–10.5)
nRBC: 0 % (ref 0.0–0.2)

## 2018-03-06 LAB — URINALYSIS, ROUTINE W REFLEX MICROSCOPIC
BACTERIA UA: NONE SEEN
BILIRUBIN URINE: NEGATIVE
Glucose, UA: NEGATIVE mg/dL
KETONES UR: 20 mg/dL — AB
Leukocytes, UA: NEGATIVE
Nitrite: NEGATIVE
PH: 5 (ref 5.0–8.0)
PROTEIN: NEGATIVE mg/dL
Specific Gravity, Urine: 1.018 (ref 1.005–1.030)

## 2018-03-06 LAB — TROPONIN I: Troponin I: <0.03 ng/mL (ref <0.03)

## 2018-03-06 NOTE — ED Provider Notes (Signed)
[facility] EMERGENCY DEPARTMENT Provider Note   CSN: [id number] Arrival date & time: 03/06/18  1647     History   Chief Complaint Chief Complaint  Patient presents with  . Dizziness    HPI Christopher Sweeney is a 67 y.o. male.  HPI Patient presents with dizziness/weakness.  States that the last couple days he has felt more lightheaded states his legs will feel weak.  States it is both legs.  Has not had a headache at times.  States his left thumb and index finger will also be a little numb at times.  No confusion.  No chest pain.  No trouble breathing.  States he is felt worse when he stands up.  Does not feel unsteady.  Does not feel drunk.  Does not feel like when he had a previous cerebellar stroke. Past Medical History:  Diagnosis Date  . Arthritis ? elbow  . Cancer [facility]) prostate  . Headache(784.0) rarely  . Hyperlipemia   . Hypertension   . Stroke [facility])     There are no active problems to display for this patient.   Past Surgical History:  Procedure Laterality Date  . APPENDECTOMY    . PROSTATECTOMY  2008  . TEE WITHOUT CARDIOVERSION  03/02/2011   Procedure: TRANSESOPHAGEAL ECHOCARDIOGRAM (TEE);  Surgeon: Fay Records, MD;  Location: [facility] ENDOSCOPY;  Service: Cardiovascular;  Laterality: N/A;        Home Medications    Prior to Admission medications   Medication Sig Start Date End Date Taking? Authorizing Provider  aspirin 325 MG tablet Take 325 mg by mouth daily.   Yes [provider]  metoprolol succinate (TOPROL-XL) 25 MG 24 hr tablet Take 25 mg by mouth daily. 07/07/13  Yes [provider]  simvastatin (ZOCOR) 20 MG tablet Take 1 tablet (20 mg total) by mouth daily at 6 PM. 03/04/11 03/06/18 Yes [place], Alisa Graff, MD  lisinopril (PRINIVIL,ZESTRIL) 10 MG tablet Take 1 tablet (10 mg total) by mouth daily. Patient not taking: Reported on 03/06/2018 03/04/11 03/03/12  Lanier Ensign, MD  solifenacin (VESICARE) 10 MG  tablet Take 10 mg by mouth daily.     [provider]    Family History Family History  Problem Relation Age of Onset  . Hypertension Sister   . Hypertension Brother     Social History Social History   Tobacco Use  . Smoking status: Never Smoker  Substance Use Topics  . Alcohol use: No  . Drug use: No     Allergies   Patient has no known allergies.   Review of Systems Review of Systems  Constitutional: Negative for activity change and fever.  HENT: Negative for congestion.   Respiratory: Negative for shortness of breath.   Cardiovascular: Negative for chest pain.  Gastrointestinal: Negative for abdominal pain.  Endocrine: Negative for polyuria.  Genitourinary: Negative for flank pain.  Musculoskeletal: Negative for back pain.  Skin: Negative for pallor.  Neurological: Positive for weakness, light-headedness and headaches.  Psychiatric/Behavioral: Negative for confusion.     Physical Exam Updated Vital Signs BP 131/78   Pulse 65   Temp 97.7 F (36.5 C) (Oral)   Resp 17   Ht 5\' 9"  (1.753 m)   Wt 76.2 kg   SpO2 99%   BMI 24.81 kg/m   Physical Exam  Constitutional: He is oriented to person, place, and time. He appears well-developed.  HENT:  Head: Atraumatic.  Eyes: Pupils are equal, round, and reactive to  light.  Neck: Neck supple.  Cardiovascular: Normal rate.  Pulmonary/Chest: Effort normal.  Abdominal: There is no tenderness.  Musculoskeletal: He exhibits no edema.  Neurological: He is alert and oriented to person, place, and time.  Base symmetric.  Ocular movements intact.  Finger-nose intact bilaterally.  Heel shin intact bilaterally.  Good grip strength bilaterally.  Good straight leg raise bilaterally.  Skin: Skin is warm.     ED Treatments / Results  Labs (all labs ordered are listed, but only abnormal results are displayed) Labs Reviewed  COMPREHENSIVE METABOLIC PANEL - Abnormal; Notable for the following components:       Result Value   CO2 21 (*)    Creatinine, Ser 1.41 (*)    Total Protein 8.4 (*)    GFR calc non Af Amer 50 (*)    GFR calc Af Amer 58 (*)    All other components within normal limits  URINALYSIS, ROUTINE W REFLEX MICROSCOPIC - Abnormal; Notable for the following components:   Hgb urine dipstick SMALL (*)    Ketones, ur 20 (*)    All other components within normal limits  TROPONIN I  CBC WITH DIFFERENTIAL/PLATELET    EKG EKG Interpretation  Date/Time:  Thursday March 06 2018 16:59:41 EST Ventricular Rate:  79 PR Interval:    QRS Duration: 85 QT Interval:  369 QTC Calculation: 423 R Axis:   78 Text Interpretation:  Sinus rhythm Right atrial enlargement Borderline repolarization abnormality No significant change since last tracing Confirmed by Davonna Belling [phone]) on 03/06/2018 8:46:30 PM   Radiology Ct Head Wo Contrast  Result Date: 03/06/2018 CLINICAL DATA:  Lightheadedness and headache. EXAM: CT HEAD WITHOUT CONTRAST TECHNIQUE: Contiguous axial images were obtained from the base of the skull through the vertex without intravenous contrast. COMPARISON:  CT scan February 28, 2011.  MRI February 28, 2011. FINDINGS: Brain: No subdural, epidural, or subarachnoid hemorrhage. The patient's known previously identified cerebellar hemisphere infarcts have evolved with more encephalomalacia. No acute cerebellar infarct noted. The brainstem is unremarkable. The basal cisterns are normal. The ventricles and sulci are unremarkable. No acute cortical ischemia or infarct is seen. No mass effect or midline shift. No other acute abnormalities. Vascular: Calcified atherosclerosis in the intracranial carotids. Skull: Normal. Negative for fracture or focal lesion. Sinuses/Orbits: Mucosal thickening scattered throughout the paranasal sinuses with no air-fluid levels. Mastoid air cells and middle ears are normal. Other: None. IMPRESSION: 1. Chronic bilateral cerebellar infarcts. No acute intracranial  abnormality identified. Electronically Signed   By: Dorise Bullion III M.D   On: 03/06/2018 20:30    Procedures Procedures (including critical care time)  Medications Ordered in ED Medications - No data to display   Initial Impression / Assessment and Plan / ED Course  I have reviewed the triage vital signs and the nursing notes.  Pertinent labs & imaging results that were available during my care of the patient were reviewed by me and considered in my medical decision making (see chart for details).     Patient with bilateral generalized weakness but worse in the legs.  Exam reassuring.  Lab work shows mild renal insufficiency.  Mild ketones in the urine.  Patient will hydrate himself little at home.  Follow-up with PCP.  Discharge home.  Final Clinical Impressions(s) / ED Diagnoses   Final diagnoses:  Generalized weakness  Chronic kidney disease, unspecified CKD stage    ED Discharge Orders    None       Davonna Belling, MD 03/06/18  2324  

## 2018-03-06 NOTE — ED Notes (Signed)
Pt alert and oriented in NAD. Pt verbalized understanding of discharge instructions. 

## 2018-03-06 NOTE — Discharge Instructions (Addendum)
Keep yourself hydrated.  Follow-up with your primary care doctor.  Continue to take your medicines.

## 2018-03-06 NOTE — ED Triage Notes (Signed)
Pt reports lightheadedness and headache that started yesterday. Pt reports that he also has some intermittent numbness in his left thumb and index finger.

## 2018-03-19 ENCOUNTER — Other Ambulatory Visit: Payer: Medicare HMO

## 2018-05-05 DIAGNOSIS — C61 Malignant neoplasm of prostate: Secondary | ICD-10-CM | POA: Diagnosis not present

## 2018-05-05 DIAGNOSIS — I639 Cerebral infarction, unspecified: Secondary | ICD-10-CM | POA: Diagnosis not present

## 2018-05-05 DIAGNOSIS — E782 Mixed hyperlipidemia: Secondary | ICD-10-CM | POA: Diagnosis not present

## 2018-05-05 DIAGNOSIS — I10 Essential (primary) hypertension: Secondary | ICD-10-CM | POA: Diagnosis not present

## 2018-05-06 DIAGNOSIS — Z6824 Body mass index (BMI) 24.0-24.9, adult: Secondary | ICD-10-CM | POA: Diagnosis not present

## 2018-05-06 DIAGNOSIS — I10 Essential (primary) hypertension: Secondary | ICD-10-CM | POA: Diagnosis not present

## 2018-05-06 DIAGNOSIS — E782 Mixed hyperlipidemia: Secondary | ICD-10-CM | POA: Diagnosis not present

## 2018-05-06 DIAGNOSIS — I639 Cerebral infarction, unspecified: Secondary | ICD-10-CM | POA: Diagnosis not present

## 2018-06-03 DIAGNOSIS — H524 Presbyopia: Secondary | ICD-10-CM | POA: Diagnosis not present

## 2018-06-03 DIAGNOSIS — H52223 Regular astigmatism, bilateral: Secondary | ICD-10-CM | POA: Diagnosis not present

## 2018-06-03 DIAGNOSIS — H5203 Hypermetropia, bilateral: Secondary | ICD-10-CM | POA: Diagnosis not present

## 2018-06-18 DIAGNOSIS — Z1211 Encounter for screening for malignant neoplasm of colon: Secondary | ICD-10-CM | POA: Diagnosis not present

## 2018-06-18 DIAGNOSIS — I10 Essential (primary) hypertension: Secondary | ICD-10-CM | POA: Diagnosis not present

## 2018-06-18 DIAGNOSIS — E782 Mixed hyperlipidemia: Secondary | ICD-10-CM | POA: Diagnosis not present

## 2018-07-01 DIAGNOSIS — Z86010 Personal history of colonic polyps: Secondary | ICD-10-CM | POA: Diagnosis not present

## 2018-09-05 DIAGNOSIS — E782 Mixed hyperlipidemia: Secondary | ICD-10-CM | POA: Diagnosis not present

## 2018-09-05 DIAGNOSIS — I10 Essential (primary) hypertension: Secondary | ICD-10-CM | POA: Diagnosis not present

## 2018-09-05 DIAGNOSIS — I639 Cerebral infarction, unspecified: Secondary | ICD-10-CM | POA: Diagnosis not present

## 2018-10-22 DIAGNOSIS — I639 Cerebral infarction, unspecified: Secondary | ICD-10-CM | POA: Diagnosis not present

## 2018-10-22 DIAGNOSIS — F5102 Adjustment insomnia: Secondary | ICD-10-CM | POA: Diagnosis not present

## 2018-10-22 DIAGNOSIS — F064 Anxiety disorder due to known physiological condition: Secondary | ICD-10-CM | POA: Diagnosis not present

## 2018-10-22 DIAGNOSIS — I10 Essential (primary) hypertension: Secondary | ICD-10-CM | POA: Diagnosis not present

## 2018-11-25 DIAGNOSIS — I10 Essential (primary) hypertension: Secondary | ICD-10-CM | POA: Diagnosis not present

## 2018-11-25 DIAGNOSIS — E785 Hyperlipidemia, unspecified: Secondary | ICD-10-CM | POA: Diagnosis not present

## 2018-12-03 ENCOUNTER — Other Ambulatory Visit: Payer: Self-pay | Admitting: Physician Assistant

## 2018-12-03 DIAGNOSIS — L308 Other specified dermatitis: Secondary | ICD-10-CM | POA: Diagnosis not present

## 2018-12-03 DIAGNOSIS — D485 Neoplasm of uncertain behavior of skin: Secondary | ICD-10-CM | POA: Diagnosis not present

## 2018-12-03 DIAGNOSIS — L905 Scar conditions and fibrosis of skin: Secondary | ICD-10-CM | POA: Diagnosis not present

## 2019-04-01 DIAGNOSIS — I10 Essential (primary) hypertension: Secondary | ICD-10-CM | POA: Diagnosis not present

## 2019-04-01 DIAGNOSIS — I639 Cerebral infarction, unspecified: Secondary | ICD-10-CM | POA: Diagnosis not present

## 2019-04-01 DIAGNOSIS — F5102 Adjustment insomnia: Secondary | ICD-10-CM | POA: Diagnosis not present

## 2019-04-01 DIAGNOSIS — C61 Malignant neoplasm of prostate: Secondary | ICD-10-CM | POA: Diagnosis not present

## 2019-04-01 DIAGNOSIS — E782 Mixed hyperlipidemia: Secondary | ICD-10-CM | POA: Diagnosis not present

## 2019-04-01 DIAGNOSIS — F064 Anxiety disorder due to known physiological condition: Secondary | ICD-10-CM | POA: Diagnosis not present

## 2019-04-30 IMAGING — CT CT HEAD W/O CM
4 series · 16 of 47 positions shown, 18 images · non-contrast
Comparison: CT scan February 28, 2011.  MRI February 28, 2011.

CLINICAL DATA: Lightheadedness and headache.

EXAM:
CT HEAD WITHOUT CONTRAST
TECHNIQUE: Contiguous axial images were obtained from the base of the skull
through the vertex without intravenous contrast.

[Series 3: head without · axial · non-contrast · 0.41mm/px · z∈[-86,+34]mm · 7 of 32 slices shown, 9 images]
[im 4/32  brain]
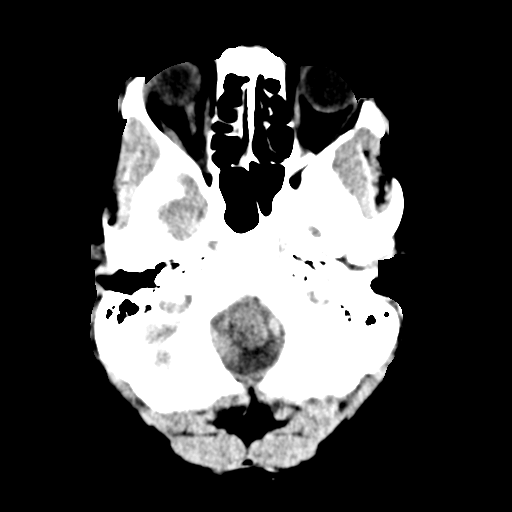
[im 4/32  bone]
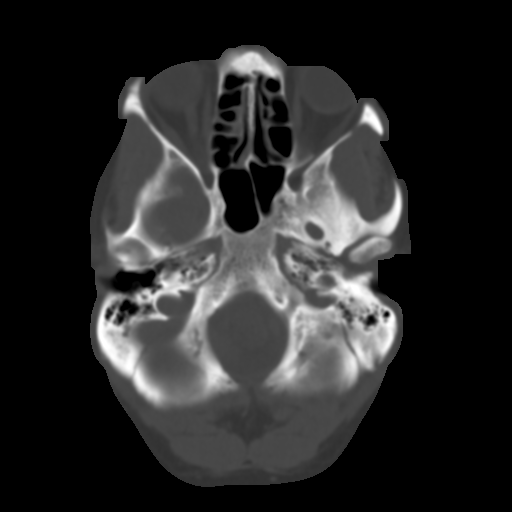
[im 8/32  brain]
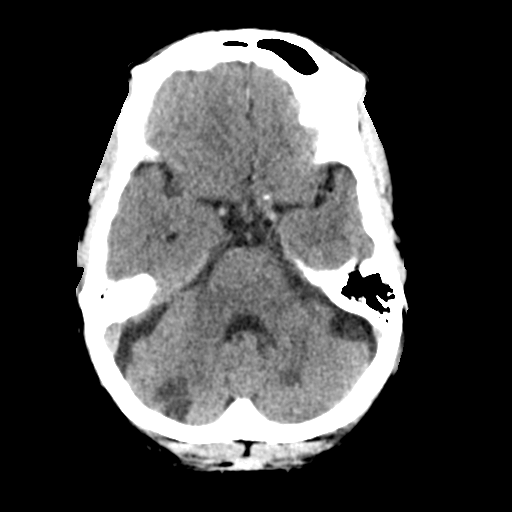
[im 12/32  brain]
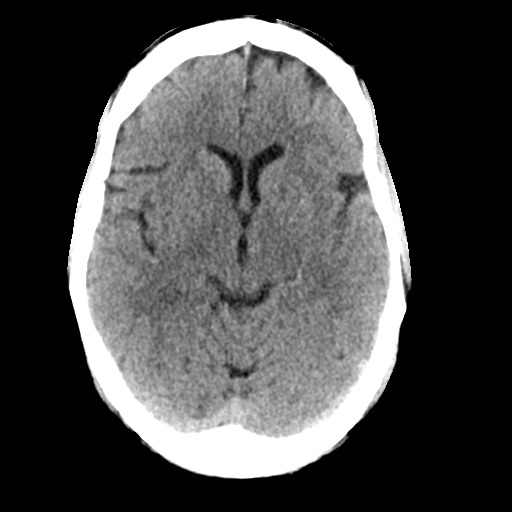
[im 16/32  brain]
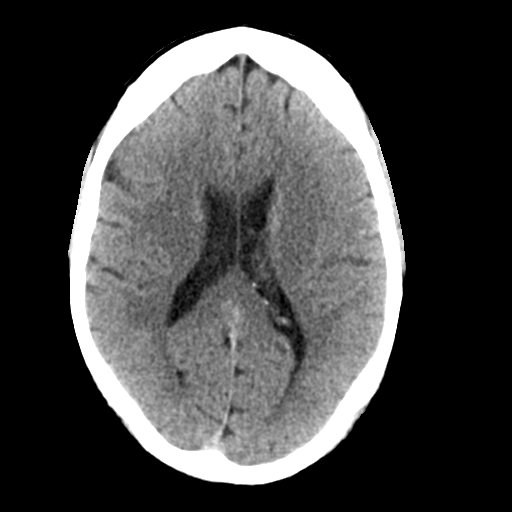
[im 20/32  brain]
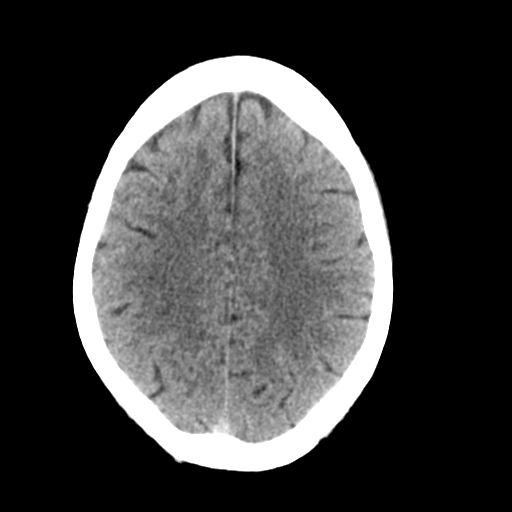
[im 20/32  bone]
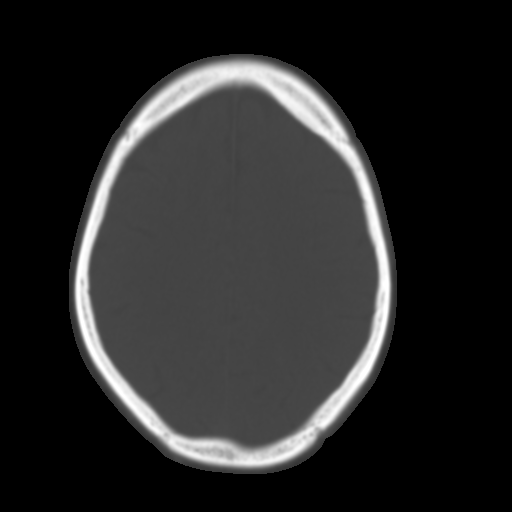
[im 24/32  brain]
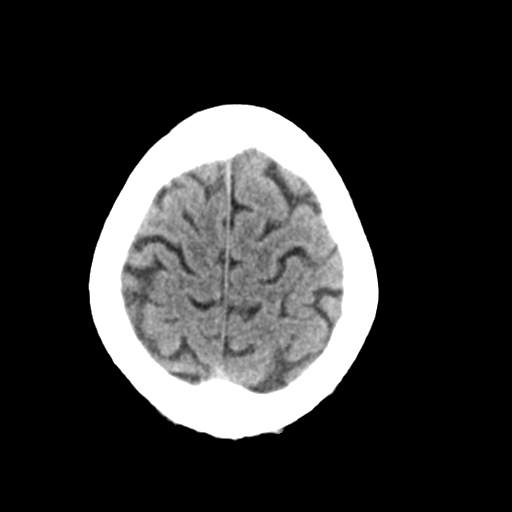
[im 28/32  brain]
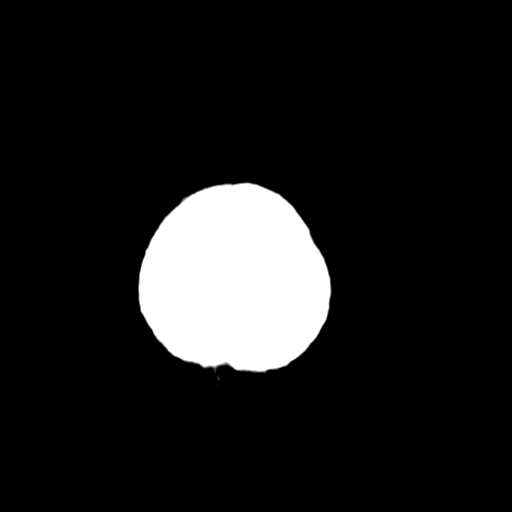

[Series 4: head bone · axial · 0.41mm/px · z∈[-88,-56]mm · 3 of 78 slices shown]
[im 8/78  bone]
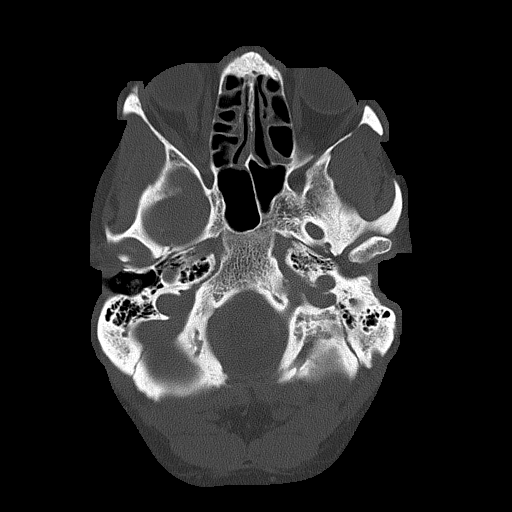
[im 16/78  bone]
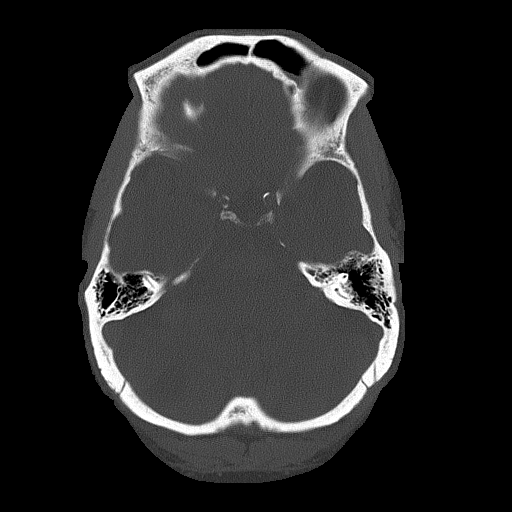
[im 24/78  bone]
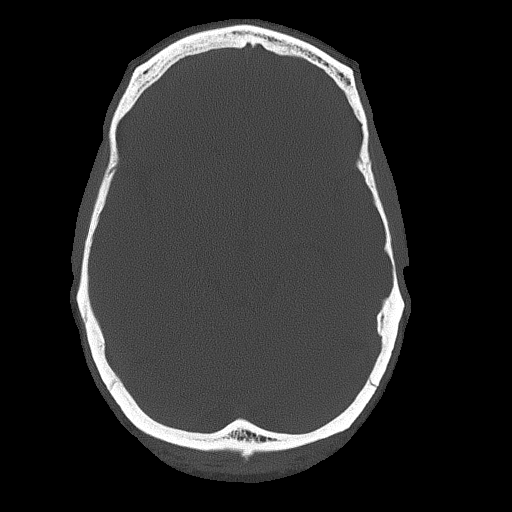

[Series 5: head without cor · coronal · non-contrast · 0.30mm/px · 3 of 69 slices shown]
[im 24/69  brain]
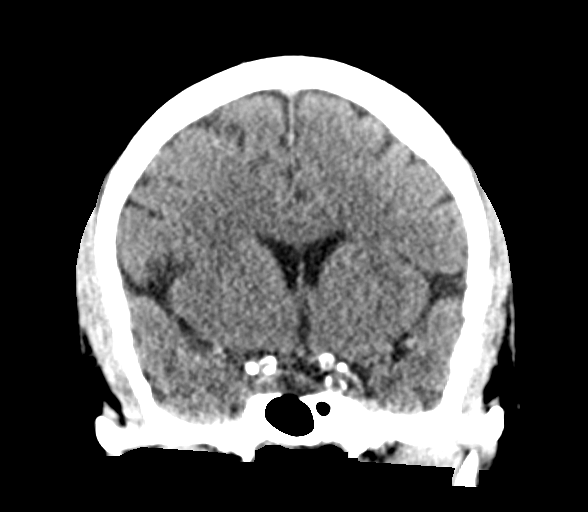
[im 31/69  brain]
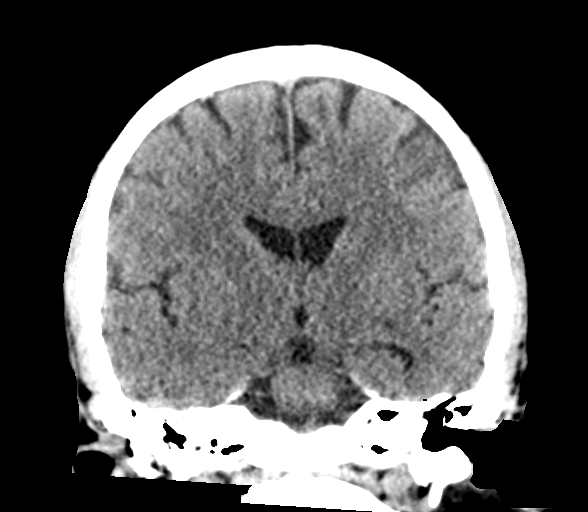
[im 38/69  brain]
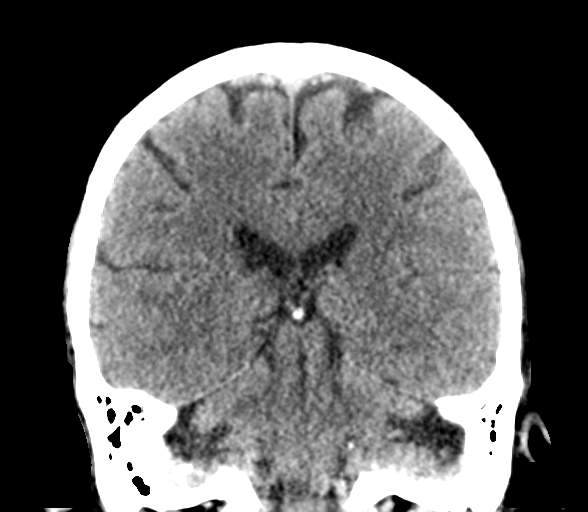

[Series 6: head without sag · sagittal · non-contrast · 0.30mm/px · 3 of 59 slices shown]
[im 20/59  brain]
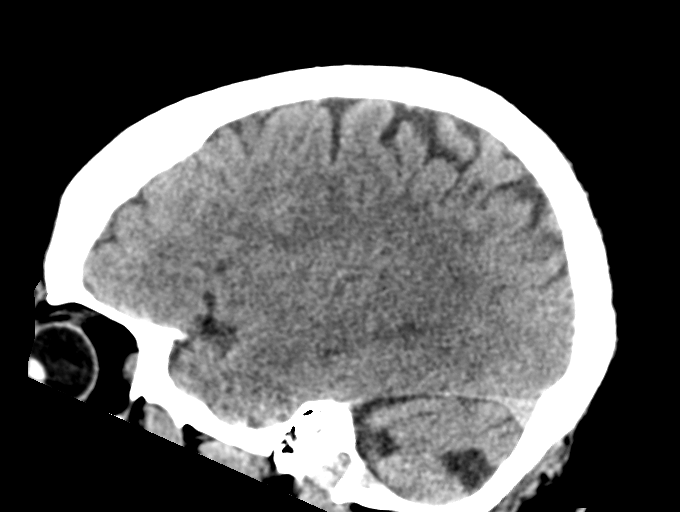
[im 30/59  brain]
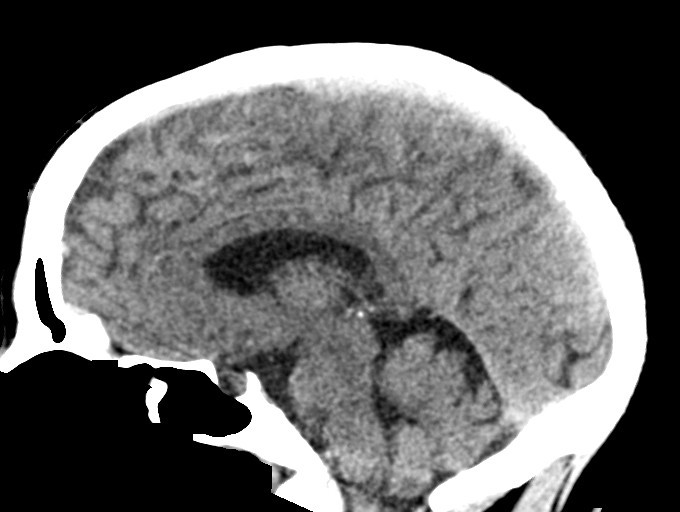
[im 39/59  brain]
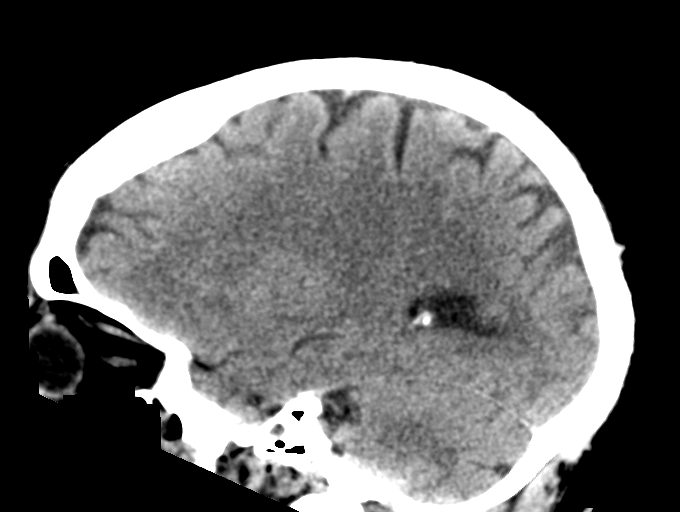

[16 of 47 positions shown; findings below may reference images not displayed]

FINDINGS: Brain: No subdural, epidural, or subarachnoid hemorrhage. The
patient's known previously identified cerebellar hemisphere infarcts
have evolved with more encephalomalacia. No acute cerebellar infarct
noted. The brainstem is unremarkable. The basal cisterns are normal.
The ventricles and sulci are unremarkable. No acute cortical
ischemia or infarct is seen. No mass effect or midline shift. No
other acute abnormalities.

Vascular: Calcified atherosclerosis in the intracranial carotids.

Skull: Normal. Negative for fracture or focal lesion.

Sinuses/Orbits: Mucosal thickening scattered throughout the
paranasal sinuses with no air-fluid levels. Mastoid air cells and
middle ears are normal.

Other: None.
IMPRESSION: 1. Chronic bilateral cerebellar infarcts. No acute intracranial
abnormality identified.

## 2019-06-05 DIAGNOSIS — R69 Illness, unspecified: Secondary | ICD-10-CM | POA: Diagnosis not present

## 2019-07-31 DIAGNOSIS — M130 Polyarthritis, unspecified: Secondary | ICD-10-CM | POA: Diagnosis not present

## 2019-07-31 DIAGNOSIS — I10 Essential (primary) hypertension: Secondary | ICD-10-CM | POA: Diagnosis not present

## 2019-07-31 DIAGNOSIS — E785 Hyperlipidemia, unspecified: Secondary | ICD-10-CM | POA: Diagnosis not present

## 2019-11-17 DIAGNOSIS — L03119 Cellulitis of unspecified part of limb: Secondary | ICD-10-CM | POA: Diagnosis not present

## 2019-11-24 DIAGNOSIS — R799 Abnormal finding of blood chemistry, unspecified: Secondary | ICD-10-CM | POA: Diagnosis not present

## 2019-11-24 DIAGNOSIS — Z125 Encounter for screening for malignant neoplasm of prostate: Secondary | ICD-10-CM | POA: Diagnosis not present

## 2019-11-24 DIAGNOSIS — I10 Essential (primary) hypertension: Secondary | ICD-10-CM | POA: Diagnosis not present

## 2019-11-24 DIAGNOSIS — E782 Mixed hyperlipidemia: Secondary | ICD-10-CM | POA: Diagnosis not present

## 2019-11-24 DIAGNOSIS — E785 Hyperlipidemia, unspecified: Secondary | ICD-10-CM | POA: Diagnosis not present

## 2019-11-30 DIAGNOSIS — Z0000 Encounter for general adult medical examination without abnormal findings: Secondary | ICD-10-CM | POA: Diagnosis not present

## 2019-11-30 DIAGNOSIS — M130 Polyarthritis, unspecified: Secondary | ICD-10-CM | POA: Diagnosis not present

## 2019-11-30 DIAGNOSIS — M545 Low back pain: Secondary | ICD-10-CM | POA: Diagnosis not present

## 2020-02-02 DIAGNOSIS — M130 Polyarthritis, unspecified: Secondary | ICD-10-CM | POA: Diagnosis not present

## 2020-02-02 DIAGNOSIS — I10 Essential (primary) hypertension: Secondary | ICD-10-CM | POA: Diagnosis not present

## 2020-02-02 DIAGNOSIS — F5102 Adjustment insomnia: Secondary | ICD-10-CM | POA: Diagnosis not present

## 2020-02-02 DIAGNOSIS — E782 Mixed hyperlipidemia: Secondary | ICD-10-CM | POA: Diagnosis not present

## 2020-03-02 DIAGNOSIS — E782 Mixed hyperlipidemia: Secondary | ICD-10-CM | POA: Diagnosis not present

## 2020-03-02 DIAGNOSIS — M130 Polyarthritis, unspecified: Secondary | ICD-10-CM | POA: Diagnosis not present

## 2020-03-02 DIAGNOSIS — F5102 Adjustment insomnia: Secondary | ICD-10-CM | POA: Diagnosis not present

## 2020-03-02 DIAGNOSIS — I10 Essential (primary) hypertension: Secondary | ICD-10-CM | POA: Diagnosis not present

## 2020-03-03 DIAGNOSIS — E782 Mixed hyperlipidemia: Secondary | ICD-10-CM | POA: Diagnosis not present

## 2020-03-03 DIAGNOSIS — I10 Essential (primary) hypertension: Secondary | ICD-10-CM | POA: Diagnosis not present

## 2020-03-03 DIAGNOSIS — I639 Cerebral infarction, unspecified: Secondary | ICD-10-CM | POA: Diagnosis not present

## 2020-03-30 ENCOUNTER — Other Ambulatory Visit: Payer: Self-pay | Admitting: Family Medicine

## 2020-03-30 DIAGNOSIS — R102 Pelvic and perineal pain: Secondary | ICD-10-CM | POA: Diagnosis not present

## 2020-03-30 DIAGNOSIS — M25551 Pain in right hip: Secondary | ICD-10-CM

## 2020-03-30 DIAGNOSIS — M130 Polyarthritis, unspecified: Secondary | ICD-10-CM | POA: Diagnosis not present

## 2020-04-01 ENCOUNTER — Ambulatory Visit
Admission: RE | Admit: 2020-04-01 | Discharge: 2020-04-01 | Disposition: A | Discharge status: Home / Self Care | Payer: Medicare HMO | Source: Ambulatory Visit | Attending: Family Medicine | Admitting: Family Medicine

## 2020-04-01 ENCOUNTER — Other Ambulatory Visit: Payer: Self-pay | Admitting: Family Medicine

## 2020-04-01 DIAGNOSIS — R102 Pelvic and perineal pain: Secondary | ICD-10-CM

## 2020-04-01 DIAGNOSIS — Z8739 Personal history of other diseases of the musculoskeletal system and connective tissue: Secondary | ICD-10-CM

## 2020-04-01 DIAGNOSIS — R52 Pain, unspecified: Secondary | ICD-10-CM

## 2020-04-01 DIAGNOSIS — M545 Low back pain, unspecified: Secondary | ICD-10-CM | POA: Diagnosis not present

## 2020-04-01 DIAGNOSIS — M130 Polyarthritis, unspecified: Secondary | ICD-10-CM

## 2020-04-19 DIAGNOSIS — M130 Polyarthritis, unspecified: Secondary | ICD-10-CM | POA: Diagnosis not present

## 2020-05-17 ENCOUNTER — Other Ambulatory Visit: Payer: Self-pay | Admitting: Family Medicine

## 2020-05-17 ENCOUNTER — Ambulatory Visit
Admission: RE | Admit: 2020-05-17 | Discharge: 2020-05-17 | Disposition: A | Discharge status: Home / Self Care | Payer: Medicare HMO | Source: Ambulatory Visit | Attending: Family Medicine | Admitting: Family Medicine

## 2020-05-17 DIAGNOSIS — M130 Polyarthritis, unspecified: Secondary | ICD-10-CM

## 2020-05-17 DIAGNOSIS — M069 Rheumatoid arthritis, unspecified: Secondary | ICD-10-CM | POA: Diagnosis not present

## 2020-05-17 DIAGNOSIS — S63512A Sprain of carpal joint of left wrist, initial encounter: Secondary | ICD-10-CM | POA: Diagnosis not present

## 2020-05-17 DIAGNOSIS — M19032 Primary osteoarthritis, left wrist: Secondary | ICD-10-CM | POA: Diagnosis not present

## 2020-05-17 DIAGNOSIS — R52 Pain, unspecified: Secondary | ICD-10-CM

## 2020-05-17 DIAGNOSIS — M19042 Primary osteoarthritis, left hand: Secondary | ICD-10-CM | POA: Diagnosis not present

## 2020-05-17 DIAGNOSIS — M1812 Unilateral primary osteoarthritis of first carpometacarpal joint, left hand: Secondary | ICD-10-CM | POA: Diagnosis not present

## 2020-07-01 DIAGNOSIS — H6123 Impacted cerumen, bilateral: Secondary | ICD-10-CM | POA: Diagnosis not present

## 2020-07-01 DIAGNOSIS — I639 Cerebral infarction, unspecified: Secondary | ICD-10-CM | POA: Diagnosis not present

## 2020-07-01 DIAGNOSIS — E782 Mixed hyperlipidemia: Secondary | ICD-10-CM | POA: Diagnosis not present

## 2020-07-01 DIAGNOSIS — E785 Hyperlipidemia, unspecified: Secondary | ICD-10-CM | POA: Diagnosis not present

## 2020-07-01 DIAGNOSIS — M130 Polyarthritis, unspecified: Secondary | ICD-10-CM | POA: Diagnosis not present

## 2020-07-01 DIAGNOSIS — R7303 Prediabetes: Secondary | ICD-10-CM | POA: Diagnosis not present

## 2020-07-01 DIAGNOSIS — I10 Essential (primary) hypertension: Secondary | ICD-10-CM | POA: Diagnosis not present

## 2020-07-01 DIAGNOSIS — S46299A Other injury of muscle, fascia and tendon of other parts of biceps, unspecified arm, initial encounter: Secondary | ICD-10-CM | POA: Diagnosis not present

## 2020-07-13 DIAGNOSIS — H6123 Impacted cerumen, bilateral: Secondary | ICD-10-CM | POA: Diagnosis not present

## 2020-09-30 DIAGNOSIS — I10 Essential (primary) hypertension: Secondary | ICD-10-CM | POA: Diagnosis not present

## 2020-09-30 DIAGNOSIS — E782 Mixed hyperlipidemia: Secondary | ICD-10-CM | POA: Diagnosis not present

## 2020-09-30 DIAGNOSIS — M130 Polyarthritis, unspecified: Secondary | ICD-10-CM | POA: Diagnosis not present

## 2020-09-30 DIAGNOSIS — I639 Cerebral infarction, unspecified: Secondary | ICD-10-CM | POA: Diagnosis not present

## 2020-09-30 DIAGNOSIS — R7303 Prediabetes: Secondary | ICD-10-CM | POA: Diagnosis not present

## 2020-11-14 ENCOUNTER — Ambulatory Visit: Payer: Medicare HMO | Admitting: Podiatry

## 2020-11-14 ENCOUNTER — Ambulatory Visit (INDEPENDENT_AMBULATORY_CARE_PROVIDER_SITE_OTHER): Payer: Medicare HMO

## 2020-11-14 ENCOUNTER — Other Ambulatory Visit: Payer: Self-pay

## 2020-11-14 ENCOUNTER — Encounter: Payer: Self-pay | Admitting: Podiatry

## 2020-11-14 DIAGNOSIS — Z860100 Personal history of colon polyps, unspecified: Secondary | ICD-10-CM | POA: Insufficient documentation

## 2020-11-14 DIAGNOSIS — Z1211 Encounter for screening for malignant neoplasm of colon: Secondary | ICD-10-CM | POA: Insufficient documentation

## 2020-11-14 DIAGNOSIS — M722 Plantar fascial fibromatosis: Secondary | ICD-10-CM

## 2020-11-14 DIAGNOSIS — M79671 Pain in right foot: Secondary | ICD-10-CM

## 2020-11-14 DIAGNOSIS — Z8673 Personal history of transient ischemic attack (TIA), and cerebral infarction without residual deficits: Secondary | ICD-10-CM | POA: Insufficient documentation

## 2020-11-14 DIAGNOSIS — C61 Malignant neoplasm of prostate: Secondary | ICD-10-CM | POA: Insufficient documentation

## 2020-11-14 DIAGNOSIS — M79603 Pain in arm, unspecified: Secondary | ICD-10-CM | POA: Insufficient documentation

## 2020-11-14 NOTE — Patient Instructions (Signed)

## 2020-11-16 NOTE — Progress Notes (Signed)
Subjective:   Patient ID: Christopher Sweeney, male   DOB: 70 y.o.   MRN: [id number]   HPI 69 year old male presents the office today for concerns of right foot pain which been ongoing for last couple of weeks.  He states when he wears flat shoes makes the symptoms worse and when he wears athletic shoes it makes it feel better.  No injury or trauma.  No recent treatment.  No numbness or tingling.  Hurts with walking, pressure.  No other concerns today.   Review of Systems  All other systems reviewed and are negative.  Past Medical History:  Diagnosis Date   Arthritis ? elbow   Cancer ([place]) prostate   Headache(784.0) rarely   Hyperlipemia    Hypertension    Stroke [facility])     Past Surgical History:  Procedure Laterality Date   APPENDECTOMY     PROSTATECTOMY  2008   TEE WITHOUT CARDIOVERSION  03/02/2011   Procedure: TRANSESOPHAGEAL ECHOCARDIOGRAM (TEE);  Surgeon: Fay Records, MD;  Location: [facility] ENDOSCOPY;  Service: Cardiovascular;  Laterality: N/A;     Current Outpatient Medications:    aspirin 325 MG tablet, Take 325 mg by mouth daily., Disp: , Rfl:    metoprolol succinate (TOPROL-XL) 25 MG 24 hr tablet, Take 25 mg by mouth daily., Disp: , Rfl:    simvastatin (ZOCOR) 40 MG tablet, , Disp: , Rfl:    solifenacin (VESICARE) 10 MG tablet, Take 10 mg by mouth daily. , Disp: , Rfl:    valsartan-hydrochlorothiazide (DIOVAN-HCT) 160-12.5 MG tablet, , Disp: , Rfl:   No Known Allergies        Objective:  Physical Exam  General: AAO x3, NAD  Dermatological: Skin is warm, dry and supple bilateral.  There are no open sores, no preulcerative lesions, no rash or signs of infection present.  Vascular: Dorsalis Pedis artery and Posterior Tibial artery pedal pulses are 2/4 bilateral with immedate capillary fill time. There is no pain with calf compression, swelling, warmth, erythema.   Neruologic: Grossly intact via light touch bilateral.   Musculoskeletal: On today's exam I am not able to  elicit any area of tenderness.  Suggested on the medial band of plantar fashion on the arch of the foot is where he gets majority of discomfort not able to identify any pain today.  No edema, erythema.  No area pinpoint tenderness.  Flexor, extensor tendons appear to be intact.  Muscular strength 5/5 in all groups tested bilateral.  Gait: Unassisted, Nonantalgic.       Assessment:   Arch pain, plantar fasciitis     Plan:  -Treatment options discussed including all alternatives, risks, and complications -Etiology of symptoms were discussed -X-rays were obtained and reviewed with the patient.  There is no evidence of acute fracture or stress fracture identified of the right foot today. -We discussed stretching, icing daily.  Discussed shoe modifications and wearing shoes with better arch supports.  He can use anti-inflammatory medications as needed.  No follow-ups on file.  Trula Slade DPM

## 2021-01-31 DIAGNOSIS — I10 Essential (primary) hypertension: Secondary | ICD-10-CM | POA: Diagnosis not present

## 2021-01-31 DIAGNOSIS — C61 Malignant neoplasm of prostate: Secondary | ICD-10-CM | POA: Diagnosis not present

## 2021-01-31 DIAGNOSIS — E785 Hyperlipidemia, unspecified: Secondary | ICD-10-CM | POA: Diagnosis not present

## 2021-01-31 DIAGNOSIS — R7303 Prediabetes: Secondary | ICD-10-CM | POA: Diagnosis not present

## 2021-04-24 DIAGNOSIS — H524 Presbyopia: Secondary | ICD-10-CM | POA: Diagnosis not present

## 2021-04-24 DIAGNOSIS — H52223 Regular astigmatism, bilateral: Secondary | ICD-10-CM | POA: Diagnosis not present

## 2021-04-24 DIAGNOSIS — H5203 Hypermetropia, bilateral: Secondary | ICD-10-CM | POA: Diagnosis not present

## 2021-07-10 DIAGNOSIS — J302 Other seasonal allergic rhinitis: Secondary | ICD-10-CM | POA: Diagnosis not present

## 2021-07-10 DIAGNOSIS — Z6825 Body mass index (BMI) 25.0-25.9, adult: Secondary | ICD-10-CM | POA: Diagnosis not present

## 2021-07-10 DIAGNOSIS — R7303 Prediabetes: Secondary | ICD-10-CM | POA: Diagnosis not present

## 2021-07-10 DIAGNOSIS — C61 Malignant neoplasm of prostate: Secondary | ICD-10-CM | POA: Diagnosis not present

## 2021-07-10 DIAGNOSIS — E785 Hyperlipidemia, unspecified: Secondary | ICD-10-CM | POA: Diagnosis not present

## 2021-07-10 DIAGNOSIS — F5102 Adjustment insomnia: Secondary | ICD-10-CM | POA: Diagnosis not present

## 2021-07-10 DIAGNOSIS — I10 Essential (primary) hypertension: Secondary | ICD-10-CM | POA: Diagnosis not present

## 2021-07-31 DIAGNOSIS — R7303 Prediabetes: Secondary | ICD-10-CM | POA: Diagnosis not present

## 2021-07-31 DIAGNOSIS — E782 Mixed hyperlipidemia: Secondary | ICD-10-CM | POA: Diagnosis not present

## 2021-07-31 DIAGNOSIS — J302 Other seasonal allergic rhinitis: Secondary | ICD-10-CM | POA: Diagnosis not present

## 2021-07-31 DIAGNOSIS — I10 Essential (primary) hypertension: Secondary | ICD-10-CM | POA: Diagnosis not present

## 2021-07-31 DIAGNOSIS — M130 Polyarthritis, unspecified: Secondary | ICD-10-CM | POA: Diagnosis not present

## 2021-11-30 DIAGNOSIS — I639 Cerebral infarction, unspecified: Secondary | ICD-10-CM | POA: Diagnosis not present

## 2021-11-30 DIAGNOSIS — E782 Mixed hyperlipidemia: Secondary | ICD-10-CM | POA: Diagnosis not present

## 2021-11-30 DIAGNOSIS — E785 Hyperlipidemia, unspecified: Secondary | ICD-10-CM | POA: Diagnosis not present

## 2021-11-30 DIAGNOSIS — I10 Essential (primary) hypertension: Secondary | ICD-10-CM | POA: Diagnosis not present

## 2022-03-30 DIAGNOSIS — E782 Mixed hyperlipidemia: Secondary | ICD-10-CM | POA: Diagnosis not present

## 2022-03-30 DIAGNOSIS — I10 Essential (primary) hypertension: Secondary | ICD-10-CM | POA: Diagnosis not present

## 2022-03-30 DIAGNOSIS — R7303 Prediabetes: Secondary | ICD-10-CM | POA: Diagnosis not present

## 2022-03-30 DIAGNOSIS — Z125 Encounter for screening for malignant neoplasm of prostate: Secondary | ICD-10-CM | POA: Diagnosis not present

## 2022-04-02 DIAGNOSIS — I10 Essential (primary) hypertension: Secondary | ICD-10-CM | POA: Diagnosis not present

## 2022-04-02 DIAGNOSIS — M545 Low back pain, unspecified: Secondary | ICD-10-CM | POA: Diagnosis not present

## 2022-04-02 DIAGNOSIS — E785 Hyperlipidemia, unspecified: Secondary | ICD-10-CM | POA: Diagnosis not present

## 2022-04-02 DIAGNOSIS — Z0000 Encounter for general adult medical examination without abnormal findings: Secondary | ICD-10-CM | POA: Diagnosis not present

## 2022-04-02 DIAGNOSIS — E782 Mixed hyperlipidemia: Secondary | ICD-10-CM | POA: Diagnosis not present

## 2022-04-21 DIAGNOSIS — J0100 Acute maxillary sinusitis, unspecified: Secondary | ICD-10-CM | POA: Diagnosis not present

## 2022-05-18 DIAGNOSIS — R799 Abnormal finding of blood chemistry, unspecified: Secondary | ICD-10-CM | POA: Diagnosis not present

## 2022-05-18 DIAGNOSIS — R7303 Prediabetes: Secondary | ICD-10-CM | POA: Diagnosis not present

## 2022-05-21 DIAGNOSIS — E782 Mixed hyperlipidemia: Secondary | ICD-10-CM | POA: Diagnosis not present

## 2022-05-21 DIAGNOSIS — F064 Anxiety disorder due to known physiological condition: Secondary | ICD-10-CM | POA: Diagnosis not present

## 2022-05-21 DIAGNOSIS — R7303 Prediabetes: Secondary | ICD-10-CM | POA: Diagnosis not present

## 2022-05-21 DIAGNOSIS — E785 Hyperlipidemia, unspecified: Secondary | ICD-10-CM | POA: Diagnosis not present

## 2022-05-21 DIAGNOSIS — I10 Essential (primary) hypertension: Secondary | ICD-10-CM | POA: Diagnosis not present

## 2022-07-18 DIAGNOSIS — H52223 Regular astigmatism, bilateral: Secondary | ICD-10-CM | POA: Diagnosis not present

## 2022-07-18 DIAGNOSIS — H5203 Hypermetropia, bilateral: Secondary | ICD-10-CM | POA: Diagnosis not present

## 2022-07-18 DIAGNOSIS — H524 Presbyopia: Secondary | ICD-10-CM | POA: Diagnosis not present

## 2022-09-14 DIAGNOSIS — I10 Essential (primary) hypertension: Secondary | ICD-10-CM | POA: Diagnosis not present

## 2022-09-14 DIAGNOSIS — E785 Hyperlipidemia, unspecified: Secondary | ICD-10-CM | POA: Diagnosis not present

## 2022-09-14 DIAGNOSIS — R7303 Prediabetes: Secondary | ICD-10-CM | POA: Diagnosis not present

## 2022-09-20 DIAGNOSIS — I639 Cerebral infarction, unspecified: Secondary | ICD-10-CM | POA: Diagnosis not present

## 2022-09-20 DIAGNOSIS — I10 Essential (primary) hypertension: Secondary | ICD-10-CM | POA: Diagnosis not present

## 2022-09-20 DIAGNOSIS — E782 Mixed hyperlipidemia: Secondary | ICD-10-CM | POA: Diagnosis not present

## 2022-09-20 DIAGNOSIS — F064 Anxiety disorder due to known physiological condition: Secondary | ICD-10-CM | POA: Diagnosis not present

## 2022-09-20 DIAGNOSIS — R7303 Prediabetes: Secondary | ICD-10-CM | POA: Diagnosis not present

## 2023-01-09 DIAGNOSIS — Z0100 Encounter for examination of eyes and vision without abnormal findings: Secondary | ICD-10-CM | POA: Diagnosis not present

## 2023-01-22 DIAGNOSIS — E785 Hyperlipidemia, unspecified: Secondary | ICD-10-CM | POA: Diagnosis not present

## 2023-01-22 DIAGNOSIS — R7303 Prediabetes: Secondary | ICD-10-CM | POA: Diagnosis not present

## 2023-01-22 DIAGNOSIS — I10 Essential (primary) hypertension: Secondary | ICD-10-CM | POA: Diagnosis not present

## 2023-01-22 DIAGNOSIS — C61 Malignant neoplasm of prostate: Secondary | ICD-10-CM | POA: Diagnosis not present

## 2023-01-22 DIAGNOSIS — Z23 Encounter for immunization: Secondary | ICD-10-CM | POA: Diagnosis not present

## 2023-01-22 DIAGNOSIS — E782 Mixed hyperlipidemia: Secondary | ICD-10-CM | POA: Diagnosis not present

## 2023-01-22 DIAGNOSIS — Z0000 Encounter for general adult medical examination without abnormal findings: Secondary | ICD-10-CM | POA: Diagnosis not present

## 2023-01-22 DIAGNOSIS — G629 Polyneuropathy, unspecified: Secondary | ICD-10-CM | POA: Diagnosis not present

## 2023-01-22 DIAGNOSIS — I639 Cerebral infarction, unspecified: Secondary | ICD-10-CM | POA: Diagnosis not present

## 2023-01-22 DIAGNOSIS — E559 Vitamin D deficiency, unspecified: Secondary | ICD-10-CM | POA: Diagnosis not present

## 2023-02-28 DIAGNOSIS — G629 Polyneuropathy, unspecified: Secondary | ICD-10-CM | POA: Diagnosis not present

## 2023-02-28 DIAGNOSIS — I10 Essential (primary) hypertension: Secondary | ICD-10-CM | POA: Diagnosis not present

## 2023-05-31 ENCOUNTER — Other Ambulatory Visit: Payer: Self-pay | Admitting: Family Medicine

## 2023-05-31 ENCOUNTER — Ambulatory Visit
Admission: RE | Admit: 2023-05-31 | Discharge: 2023-05-31 | Disposition: A | Discharge status: Home / Self Care | Payer: Medicare HMO | Source: Ambulatory Visit | Attending: Family Medicine | Admitting: Family Medicine

## 2023-05-31 DIAGNOSIS — M130 Polyarthritis, unspecified: Secondary | ICD-10-CM

## 2023-05-31 DIAGNOSIS — M19041 Primary osteoarthritis, right hand: Secondary | ICD-10-CM | POA: Diagnosis not present

## 2023-05-31 DIAGNOSIS — G629 Polyneuropathy, unspecified: Secondary | ICD-10-CM | POA: Diagnosis not present

## 2023-05-31 DIAGNOSIS — M19042 Primary osteoarthritis, left hand: Secondary | ICD-10-CM | POA: Diagnosis not present

## 2023-05-31 DIAGNOSIS — M79642 Pain in left hand: Secondary | ICD-10-CM | POA: Diagnosis not present

## 2023-05-31 DIAGNOSIS — M79641 Pain in right hand: Secondary | ICD-10-CM | POA: Diagnosis not present

## 2023-05-31 DIAGNOSIS — E782 Mixed hyperlipidemia: Secondary | ICD-10-CM | POA: Diagnosis not present

## 2023-05-31 DIAGNOSIS — I10 Essential (primary) hypertension: Secondary | ICD-10-CM | POA: Diagnosis not present

## 2023-06-07 ENCOUNTER — Telehealth: Payer: Self-pay | Admitting: Podiatry

## 2023-06-24 ENCOUNTER — Ambulatory Visit: Payer: Medicare HMO | Admitting: Podiatry

## 2023-07-02 ENCOUNTER — Ambulatory Visit: Payer: Medicare HMO | Admitting: Podiatry

## 2023-07-02 ENCOUNTER — Encounter: Payer: Self-pay | Admitting: Podiatry

## 2023-07-02 DIAGNOSIS — G629 Polyneuropathy, unspecified: Secondary | ICD-10-CM | POA: Diagnosis not present

## 2023-07-02 MED ORDER — GABAPENTIN 100 MG PO CAPS: 100.0000 mg | ORAL_CAPSULE | Freq: Every day | ORAL | 0 refills | Status: DC

## 2023-07-02 NOTE — Patient Instructions (Addendum)
 Neuropathic Pain Neuropathic pain is pain caused by damage to the nerves that are responsible for certain sensations in your body (sensory nerves). Neuropathic pain can make you more sensitive to pain. Even a minor sensation can feel very painful. This is usually a long-term (chronic) condition that can be difficult to treat. The type of pain differs from person to person. It may: Start suddenly (acute), or it may develop slowly and become chronic. Come and go as damaged nerves heal, or it may stay at the same level for years. Cause emotional distress, loss of sleep, and a lower quality of life. What are the causes? The most common cause of this condition is diabetes. Many other diseases and conditions can also cause neuropathic pain. Causes of neuropathic pain can be classified as: Toxic. This is caused by medicines and chemicals. The most common causes of toxic neuropathic pain is damage from medicines that kill cancer cells (chemotherapy) or alcohol abuse. Metabolic. This can be caused by: Diabetes. Lack of vitamins like B12. Traumatic. Any injury that cuts, crushes, or stretches a nerve can cause damage and pain. Compression-related. If a sensory nerve gets trapped or compressed for a long period of time, the blood supply to the nerve can be cut off. Vascular. Many blood vessel diseases can cause neuropathic pain by decreasing blood supply and oxygen to nerves. Autoimmune. This type of pain results from diseases in which the body's defense system (immune system) mistakenly attacks sensory nerves. Examples of autoimmune diseases that can cause neuropathic pain include lupus and multiple sclerosis. Infectious. Many types of viral infections can damage sensory nerves and cause pain. Shingles infection is a common cause of this type of pain. Inherited. Neuropathic pain can be a symptom of many diseases that are passed down through families (genetic). What increases the risk? You are more likely to  develop this condition if: You have diabetes. You smoke. You drink too much alcohol. You are taking certain medicines, including chemotherapy or medicines that treat immune system disorders. What are the signs or symptoms? The main symptom is pain. Neuropathic pain is often described as: Burning. Shock-like. Stinging. Hot or cold. Itching. How is this diagnosed? No single test can diagnose neuropathic pain. It is diagnosed based on: A physical exam and your symptoms. Your health care provider will ask you about your pain. You may be asked to use a pain scale to describe how bad your pain is. Tests. These may be done to see if you have a cause and location of any nerve damage. They include: Nerve conduction studies and electromyography to test how well nerve signals travel through your nerves and muscles (electrodiagnostic testing). Skin biopsy to evaluate for small fiber neuropathy. Imaging studies, such as: X-rays. CT scan. MRI. How is this treated? Treatment for neuropathic pain may change over time. You may need to try different treatment options or a combination of treatments. Some options include: Treating the underlying cause of the neuropathy, such as diabetes, kidney disease, or vitamin deficiencies. Stopping medicines that can cause neuropathy, such as chemotherapy. Medicine to relieve pain. Medicines may include: Prescription or over-the-counter pain medicine. Anti-seizure medicine. Antidepressant medicines. Pain-relieving patches or creams that are applied to painful areas of skin. A medicine to numb the area (local anesthetic), which can be injected as a nerve block. Transcutaneous nerve stimulation. This uses electrical currents to block painful nerve signals. The treatment is painless. Alternative treatments, such as: Acupuncture. Meditation. Massage. Occupational or physical therapy. Pain management programs. Counseling. Follow  these instructions at  home: Medicines  Take over-the-counter and prescription medicines only as told by your health care provider. Ask your health care provider if the medicine prescribed to you: Requires you to avoid driving or using machinery. Can cause constipation. You may need to take these actions to prevent or treat constipation: Drink enough fluid to keep your urine pale yellow. Take over-the-counter or prescription medicines. Eat foods that are high in fiber, such as beans, whole grains, and fresh fruits and vegetables. Limit foods that are high in fat and processed sugars, such as fried or sweet foods. Lifestyle  Have a good support system at home. Consider joining a chronic pain support group. Do not use any products that contain nicotine or tobacco. These products include cigarettes, chewing tobacco, and vaping devices, such as e-cigarettes. If you need help quitting, ask your health care provider. Do not drink alcohol. General instructions Learn as much as you can about your condition. Work closely with all your health care providers to find the treatment plan that works best for you. Ask your health care provider what activities are safe for you. Keep all follow-up visits. This is important. Contact a health care provider if: Your pain treatments are not working. You are having side effects from your medicines. You are struggling with tiredness (fatigue), mood changes, depression, or anxiety. Get help right away if: You have thoughts of hurting yourself. Get help right away if you feel like you may hurt yourself or others, or have thoughts about taking your own life. Go to your nearest emergency room or: Call 911. Call the National Suicide Prevention Lifeline at 417-428-9542 or 988. This is open 24 hours a day. Text the Crisis Text Line at (430) 407-8520. Summary Neuropathic pain is pain caused by damage to the nerves that are responsible for certain sensations in your body (sensory  nerves). Neuropathic pain may come and go as damaged nerves heal, or it may stay at the same level for years. Neuropathic pain is usually a long-term condition that can be difficult to treat. Consider joining a chronic pain support group. This information is not intended to replace advice given to you by your health care provider. Make sure you discuss any questions you have with your health care provider. Document Revised: 11/28/2020 Document Reviewed: 11/28/2020 Elsevier Patient Education  2024 [employer]  Gabapentin Capsules or Tablets What is this medication? GABAPENTIN (GA ba pen tin) treats nerve pain. It may also be used to prevent and control seizures in people with epilepsy. It works by calming overactive nerves in your body. This medicine may be used for other purposes; ask your health care provider or pharmacist if you have questions. COMMON BRAND NAME(S): Active-PAC with Gabapentin, Ascencion Dike, Gralise, Neurontin What should I tell my care team before I take this medication? They need to know if you have any of these conditions: Kidney disease Lung or breathing disease Substance use disorder Suicidal thoughts, plans, or attempt by you or a family member An unusual or allergic reaction to gabapentin, other medications, foods, dyes, or preservatives Pregnant or trying to get pregnant Breastfeeding How should I use this medication? Take this medication by mouth with a glass of water. Follow the directions on the prescription label. You can take it with or without food. If it upsets your stomach, take it with food. Take your medication at regular intervals. Do not take it more often than directed. Do not stop taking except on your care team's advice. If you are  directed to break the 600 or 800 mg tablets in half as part of your dose, the extra half tablet should be used for the next dose. If you have not used the extra half tablet within 28 days, it should be thrown away. A special  MedGuide will be given to you by the pharmacist with each prescription and refill. Be sure to read this information carefully each time. Talk to your care team about the use of this medication in children. While this medication may be prescribed for children as young as 3 years for selected conditions, precautions do apply. Overdosage: If you think you have taken too much of this medicine contact a poison control center or emergency room at once. NOTE: This medicine is only for you. Do not share this medicine with others. What if I miss a dose? If you miss a dose, take it as soon as you can. If it is almost time for your next dose, take only that dose. Do not take double or extra doses. What may interact with this medication? Alcohol Antihistamines for allergy, cough, and cold Certain medications for anxiety or sleep Certain medications for depression like amitriptyline, fluoxetine, sertraline Certain medications for seizures like phenobarbital, primidone Certain medications for stomach problems General anesthetics like halothane, isoflurane, methoxyflurane, propofol Local anesthetics like lidocaine, pramoxine, tetracaine Medications that relax muscles for surgery Opioid medications for pain Phenothiazines like chlorpromazine, mesoridazine, prochlorperazine, thioridazine This list may not describe all possible interactions. Give your health care provider a list of all the medicines, herbs, non-prescription drugs, or dietary supplements you use. Also tell them if you smoke, drink alcohol, or use illegal drugs. Some items may interact with your medicine. What should I watch for while using this medication? Visit your care team for regular checks on your progress. You may want to keep a record at home of how you feel your condition is responding to treatment. You may want to share this information with your care team at each visit. You should contact your care team if your seizures get worse or if  you have any new types of seizures. Do not stop taking this medication or any of your seizure medications unless instructed by your care team. Stopping your medication suddenly can increase your seizures or their severity. This medication may cause serious skin reactions. They can happen weeks to months after starting the medication. Contact your care team right away if you notice fevers or flu-like symptoms with a rash. The rash may be red or purple and then turn into blisters or peeling of the skin. Or, you might notice a red rash with swelling of the face, lips or lymph nodes in your neck or under your arms. Wear a medical identification bracelet or chain if you are taking this medication for seizures. Carry a card that lists all your medications. This medication may affect your coordination, reaction time, or judgment. Do not drive or operate machinery until you know how this medication affects you. Sit up or stand slowly to reduce the risk of dizzy or fainting spells. Drinking alcohol with this medication can increase the risk of these side effects. Your mouth may get dry. Chewing sugarless gum or sucking hard candy, and drinking plenty of water may help. Watch for new or worsening thoughts of suicide or depression. This includes sudden changes in mood, behaviors, or thoughts. These changes can happen at any time but are more common in the beginning of treatment or after a change in dose. Call  your care team right away if you experience these thoughts or worsening depression. If you become pregnant while using this medication, you may enroll in the [place] Antiepileptic Drug Pregnancy Registry by calling [phone]. This registry collects information about the safety of antiepileptic medication use during pregnancy. What side effects may I notice from receiving this medication? Side effects that you should report to your care team as soon as possible: Allergic reactions or angioedema--skin  rash, itching, hives, swelling of the face, eyes, lips, tongue, arms, or legs, trouble swallowing or breathing Rash, fever, and swollen lymph nodes Thoughts of suicide or self harm, worsening mood, feelings of depression Trouble breathing Unusual changes in mood or behavior in children after use such as difficulty concentrating, hostility, or restlessness Side effects that usually do not require medical attention (report to your care team if they continue or are bothersome): Dizziness Drowsiness Nausea Swelling of ankles, feet, or hands Vomiting This list may not describe all possible side effects. Call your doctor for medical advice about side effects. You may report side effects to FDA at 1-800-FDA-1088. Where should I keep my medication? Keep out of reach of children and pets. Store at room temperature between 15 and 30 degrees C (59 and 86 degrees F). Get rid of any unused medication after the expiration date. This medication may cause accidental overdose and death if taken by other adults, children, or pets. To get rid of medications that are no longer needed or have expired: Take the medication to a medication take-back program. Check with your pharmacy or law enforcement to find a location. If you cannot return the medication, check the label or package insert to see if the medication should be thrown out in the garbage or flushed down the toilet. If you are not sure, ask your care team. If it is safe to put it in the trash, empty the medication out of the container. Mix the medication with cat litter, dirt, coffee grounds, or other unwanted substance. Seal the mixture in a bag or container. Put it in the trash. NOTE: This sheet is a summary. It may not cover all possible information. If you have questions about this medicine, talk to your doctor, pharmacist, or health care provider.  2024 Elsevier/Gold Standard (2022-01-16 00:00:00)

## 2023-07-03 NOTE — Progress Notes (Signed)
  Subjective:  Patient ID: Christopher Sweeney, male    DOB: [birth year],  MRN: [id number]  Chief Complaint  Patient presents with   Peripheral Neuropathy    Pt complains of bilateral neuropathy. He states that it feels like he is walking on "bubbles". Symptoms have persisted for a couple of months. He has taking neurviv as prescribed by his primary care provider.    Discussed the use of AI scribe software for clinical note transcription with the patient, who gave verbal consent to proceed.  History of Present Illness          Christopher Sweeney is a 73 year old male who presents with neuropathy symptoms in his feet.  He experiences a sensation in his feet described as walking on bubble wrap, without pain but with occasional tingling. The duration of these symptoms is unspecified. He became aware of the term 'neuropathy' after a doctor noted his inability to feel pins in his feet during an examination two months ago.  No injuries, back pain, or radiating pain down the legs. No changes in medication prior to the onset of symptoms. Current medications include a statin, metoprolol, and valsartan. Recently started taking vitamin D3. He is not diabetic but is aware of being in the 'pre' diabetic stage. He used over-the-counter Nervive vitamins for neuropathy but discontinued them after the initial supply ran out. He does not consume alcohol.  During the review of symptoms, no pain in the feet, only the unusual sensation, with occasional tingling. No other significant symptoms.   Objective:    Physical Exam         General: AAO x3, NAD  Dermatological: Skin is warm, dry and supple bilateral.  There are no open sores, no preulcerative lesions, no rash or signs of infection present.  Vascular: Dorsalis Pedis artery and Posterior Tibial artery pedal pulses are 2/4 bilateral with immedate capillary fill time There is no pain with calf compression, swelling, warmth, erythema.   Neruologic: Sensation  decreased in some form is wanting monofilament to the plantar aspect of the feet.  Vibratory sensation intact.  Negative Tinel sign.  Musculoskeletal: There is no area pinpoint tenderness identified.  Flexor, extensor tendons are intact.  MMT 5/5.  Gait: Unassisted, Nonantalgic.    No images are attached to the encounter.    Results          Assessment:   1. Neuropathy      Plan:  Patient was evaluated and treated and all questions answered.  Assessment and Plan          Peripheral Neuropathy Peripheral neuropathy with numbness and tingling in feet. Possible idiopathic etiology. Further investigation needed. Gabapentin discussed for symptom management. - Order blood tests for vitamin deficiencies and thyroid function. - Prescribe gabapentin 100 mg at night.  Discussed side effects.  Can titrate up as needed. -Discussed Nervive - Provide address for blood work location and instruct him to walk in for testing. - Call with blood work results once available.   Return in about 2 months (around 09/01/2023) for neuropathy .   Vivi Barrack DPM

## 2023-07-05 ENCOUNTER — Other Ambulatory Visit: Payer: Self-pay | Admitting: Podiatry

## 2023-07-05 ENCOUNTER — Telehealth: Payer: Self-pay | Admitting: Podiatry

## 2023-07-05 DIAGNOSIS — E569 Vitamin deficiency, unspecified: Secondary | ICD-10-CM

## 2023-07-05 DIAGNOSIS — E079 Disorder of thyroid, unspecified: Secondary | ICD-10-CM

## 2023-07-05 NOTE — Telephone Encounter (Signed)
 Received message B6, B12 and TSH not covered. New orders placed with updated diagnosis. These are to check for neuropathy etiologies.

## 2023-07-10 DIAGNOSIS — E079 Disorder of thyroid, unspecified: Secondary | ICD-10-CM | POA: Diagnosis not present

## 2023-07-10 DIAGNOSIS — E531 Pyridoxine deficiency: Secondary | ICD-10-CM | POA: Diagnosis not present

## 2023-07-10 DIAGNOSIS — G629 Polyneuropathy, unspecified: Secondary | ICD-10-CM | POA: Diagnosis not present

## 2023-07-10 DIAGNOSIS — R5383 Other fatigue: Secondary | ICD-10-CM | POA: Diagnosis not present

## 2023-07-10 DIAGNOSIS — E569 Vitamin deficiency, unspecified: Secondary | ICD-10-CM | POA: Diagnosis not present

## 2023-07-14 LAB — VITAMIN B6: Vitamin B6: 10.2 ng/mL (ref 2.1–21.7)

## 2023-07-14 LAB — C-REACTIVE PROTEIN: CRP: <3 mg/L (ref <8.0)

## 2023-07-14 LAB — TSH: TSH: 1.34 m[IU]/L (ref 0.40–4.50)

## 2023-07-14 LAB — VITAMIN B1: Vitamin B1 (Thiamine): 11 nmol/L (ref 8–30)

## 2023-07-14 LAB — SEDIMENTATION RATE: Sed Rate: 6 mm/h (ref 0–20)

## 2023-07-14 LAB — VITAMIN B12: Vitamin B-12: 385 pg/mL (ref 200–1100)

## 2023-07-16 ENCOUNTER — Encounter: Payer: Self-pay | Admitting: Podiatry

## 2023-08-08 ENCOUNTER — Other Ambulatory Visit: Payer: Self-pay | Admitting: Podiatry

## 2023-09-04 ENCOUNTER — Other Ambulatory Visit: Payer: Self-pay | Admitting: Podiatry

## 2023-09-05 DIAGNOSIS — I10 Essential (primary) hypertension: Secondary | ICD-10-CM | POA: Diagnosis not present

## 2023-09-05 DIAGNOSIS — R42 Dizziness and giddiness: Secondary | ICD-10-CM | POA: Diagnosis not present

## 2023-09-05 DIAGNOSIS — R7303 Prediabetes: Secondary | ICD-10-CM | POA: Diagnosis not present

## 2023-09-05 DIAGNOSIS — Z6824 Body mass index (BMI) 24.0-24.9, adult: Secondary | ICD-10-CM | POA: Diagnosis not present

## 2023-09-05 DIAGNOSIS — E782 Mixed hyperlipidemia: Secondary | ICD-10-CM | POA: Diagnosis not present

## 2023-09-05 DIAGNOSIS — F064 Anxiety disorder due to known physiological condition: Secondary | ICD-10-CM | POA: Diagnosis not present

## 2023-09-05 DIAGNOSIS — E785 Hyperlipidemia, unspecified: Secondary | ICD-10-CM | POA: Diagnosis not present

## 2023-09-30 DIAGNOSIS — R42 Dizziness and giddiness: Secondary | ICD-10-CM | POA: Diagnosis not present

## 2023-09-30 DIAGNOSIS — R102 Pelvic and perineal pain: Secondary | ICD-10-CM | POA: Diagnosis not present

## 2023-09-30 DIAGNOSIS — M130 Polyarthritis, unspecified: Secondary | ICD-10-CM | POA: Diagnosis not present

## 2023-10-01 ENCOUNTER — Ambulatory Visit
Admission: RE | Admit: 2023-10-01 | Discharge: 2023-10-01 | Disposition: A | Discharge status: Home / Self Care | Source: Ambulatory Visit | Attending: Family Medicine | Admitting: Family Medicine

## 2023-10-01 ENCOUNTER — Other Ambulatory Visit: Payer: Self-pay | Admitting: Family Medicine

## 2023-10-01 DIAGNOSIS — M25551 Pain in right hip: Secondary | ICD-10-CM | POA: Diagnosis not present

## 2023-10-01 DIAGNOSIS — M130 Polyarthritis, unspecified: Secondary | ICD-10-CM

## 2023-10-03 ENCOUNTER — Other Ambulatory Visit: Payer: Self-pay | Admitting: Podiatry

## 2023-10-31 ENCOUNTER — Other Ambulatory Visit: Payer: Self-pay | Admitting: Podiatry

## 2023-11-29 ENCOUNTER — Other Ambulatory Visit: Payer: Self-pay | Admitting: Podiatry

## 2024-01-01 ENCOUNTER — Other Ambulatory Visit: Payer: Self-pay | Admitting: Podiatry

## 2024-01-30 DIAGNOSIS — F064 Anxiety disorder due to known physiological condition: Secondary | ICD-10-CM | POA: Diagnosis not present

## 2024-01-30 DIAGNOSIS — I10 Essential (primary) hypertension: Secondary | ICD-10-CM | POA: Diagnosis not present

## 2024-01-30 DIAGNOSIS — E782 Mixed hyperlipidemia: Secondary | ICD-10-CM | POA: Diagnosis not present

## 2024-01-30 DIAGNOSIS — J399 Disease of upper respiratory tract, unspecified: Secondary | ICD-10-CM | POA: Diagnosis not present

## 2024-01-30 DIAGNOSIS — G629 Polyneuropathy, unspecified: Secondary | ICD-10-CM | POA: Diagnosis not present

## 2024-01-30 DIAGNOSIS — Z125 Encounter for screening for malignant neoplasm of prostate: Secondary | ICD-10-CM | POA: Diagnosis not present

## 2024-01-30 DIAGNOSIS — R7309 Other abnormal glucose: Secondary | ICD-10-CM | POA: Diagnosis not present

## 2024-05-08 ENCOUNTER — Encounter (INDEPENDENT_AMBULATORY_CARE_PROVIDER_SITE_OTHER): Payer: Self-pay

## 2024-06-03 ENCOUNTER — Institutional Professional Consult (permissible substitution) (INDEPENDENT_AMBULATORY_CARE_PROVIDER_SITE_OTHER): Admitting: Otolaryngology
